# Patient Record
Sex: Female | Born: 1990 | Race: Black or African American | Hispanic: No | Marital: Single | Smoking: Current every day smoker
Health system: Southern US, Community
[De-identification: ages and names within clinical notes are randomized; demographics above are authoritative.]

## PROBLEM LIST (undated history)

## (undated) ENCOUNTER — Inpatient Hospital Stay (HOSPITAL_COMMUNITY): Payer: Self-pay

## (undated) DIAGNOSIS — B009 Herpesviral infection, unspecified: Secondary | ICD-10-CM

## (undated) DIAGNOSIS — A749 Chlamydial infection, unspecified: Secondary | ICD-10-CM

## (undated) DIAGNOSIS — O24419 Gestational diabetes mellitus in pregnancy, unspecified control: Secondary | ICD-10-CM

## (undated) DIAGNOSIS — A599 Trichomoniasis, unspecified: Secondary | ICD-10-CM

## (undated) HISTORY — PX: NO PAST SURGERIES: SHX2092

---

## 2012-12-28 ENCOUNTER — Inpatient Hospital Stay (HOSPITAL_COMMUNITY)
Admission: AD | Admit: 2012-12-28 | Discharge: 2012-12-28 | Disposition: A | Discharge status: Home / Self Care | Payer: Self-pay | Source: Ambulatory Visit | Attending: Obstetrics & Gynecology | Admitting: Obstetrics & Gynecology

## 2012-12-28 DIAGNOSIS — R300 Dysuria: Secondary | ICD-10-CM | POA: Insufficient documentation

## 2012-12-28 DIAGNOSIS — A499 Bacterial infection, unspecified: Secondary | ICD-10-CM | POA: Insufficient documentation

## 2012-12-28 DIAGNOSIS — N760 Acute vaginitis: Secondary | ICD-10-CM | POA: Insufficient documentation

## 2012-12-28 DIAGNOSIS — Z3202 Encounter for pregnancy test, result negative: Secondary | ICD-10-CM | POA: Insufficient documentation

## 2012-12-28 DIAGNOSIS — B9689 Other specified bacterial agents as the cause of diseases classified elsewhere: Secondary | ICD-10-CM | POA: Insufficient documentation

## 2012-12-28 LAB — URINALYSIS, ROUTINE W REFLEX MICROSCOPIC
Leukocytes, UA: NEGATIVE
Nitrite: NEGATIVE
Specific Gravity, Urine: >1.03 — ABNORMAL HIGH (ref 1.005–1.030)
Urobilinogen, UA: 0.2 mg/dL (ref 0.0–1.0)

## 2012-12-28 LAB — POCT PREGNANCY, URINE: Preg Test, Ur: NEGATIVE

## 2012-12-28 LAB — URINE MICROSCOPIC-ADD ON

## 2012-12-28 MED ORDER — METRONIDAZOLE 500 MG PO TABS: 500.0000 mg | ORAL_TABLET | Freq: Two times a day (BID) | ORAL | Status: DC

## 2012-12-28 NOTE — MAU Note (Signed)
Patient states she had a positive home pregnancy test yesterday and has been having pain with urination since then. Denies any bleeding or discharge.

## 2012-12-28 NOTE — MAU Note (Signed)
Patient presents to MAU with c/o burning with urination since earlier today. Reports she had a positive home pregnancy test a couple weeks ago.  Reports new sexual partner; denies vaginal bleeding or cramping.

## 2012-12-28 NOTE — MAU Provider Note (Signed)
History     CSN: [id number]  Arrival date and time: 12/28/12 1743   None     Chief Complaint  Patient presents with  . Possible Pregnancy  . Dysuria   HPI 22 y.o. with c/o burning with urination since earlier today. Reports she had a positive home pregnancy test a couple weeks ago.    History  Substance Use Topics  . Smoking status: Not on file  . Smokeless tobacco: Not on file  . Alcohol Use: Not on file    Allergies: No Known Allergies  No prescriptions prior to admission    Review of Systems  Constitutional: Negative.   Respiratory: Negative.   Cardiovascular: Negative.   Gastrointestinal: Negative for nausea, vomiting, abdominal pain, diarrhea and constipation.  Genitourinary: Positive for dysuria. Negative for urgency, frequency, hematuria and flank pain.       Negative for vaginal bleeding, vaginal discharge, dyspareunia  Musculoskeletal: Negative.   Neurological: Negative.   Psychiatric/Behavioral: Negative.    Physical Exam   Blood pressure 140/80, pulse 82, temperature 98.2 F (36.8 C), temperature source Oral, resp. rate 16, height 5\' 4"  (1.626 m), weight 124 lb 12.8 oz (56.609 kg), last menstrual period 11/12/2012, SpO2 100.00%.  Physical Exam  Nursing note and vitals reviewed. Constitutional: She is oriented to person, place, and time. She appears well-developed and well-nourished. No distress.  Cardiovascular: Normal rate.   Respiratory: Effort normal.  GI: Soft. There is no tenderness.  Genitourinary: There is no tenderness or lesion on the right labia. There is no tenderness or lesion on the left labia. Uterus is not enlarged and not tender. Cervix exhibits no motion tenderness, no discharge and no friability. Right adnexum displays no mass, no tenderness and no fullness. Left adnexum displays no mass, no tenderness and no fullness. No bleeding around the vagina. Vaginal discharge found.  Musculoskeletal: Normal range of motion.  Neurological: She  is alert and oriented to person, place, and time.  Skin: Skin is warm and dry.  Psychiatric: She has a normal mood and affect.    MAU Course  Procedures Results for orders placed during the hospital encounter of 12/28/12 (from the past 24 hour(s))  URINALYSIS, ROUTINE W REFLEX MICROSCOPIC     Status: Abnormal   Collection Time    12/28/12  6:10 PM      Result Value Range   Color, Urine YELLOW  YELLOW   APPearance CLEAR  CLEAR   Specific Gravity, Urine >1.030 (*) 1.005 - 1.030   pH 6.0  5.0 - 8.0   Glucose, UA NEGATIVE  NEGATIVE mg/dL   Hgb urine dipstick NEGATIVE  NEGATIVE   Bilirubin Urine NEGATIVE  NEGATIVE   Ketones, ur NEGATIVE  NEGATIVE mg/dL   Protein, ur 30 (*) NEGATIVE mg/dL   Urobilinogen, UA 0.2  0.0 - 1.0 mg/dL   Nitrite NEGATIVE  NEGATIVE   Leukocytes, UA NEGATIVE  NEGATIVE  URINE MICROSCOPIC-ADD ON     Status: Abnormal   Collection Time    12/28/12  6:10 PM      Result Value Range   Squamous Epithelial / LPF MANY (*) RARE   WBC, UA 0-2  <3 WBC/hpf   Urine-Other MUCOUS PRESENT    POCT PREGNANCY, URINE     Status: None   Collection Time    12/28/12  6:18 PM      Result Value Range   Preg Test, Ur NEGATIVE  NEGATIVE  WET PREP, GENITAL     Status: Abnormal   Collection  Time    12/28/12  9:00 PM      Result Value Range   Yeast Wet Prep HPF POC NONE SEEN  NONE SEEN   Trich, Wet Prep NONE SEEN  NONE SEEN   Clue Cells Wet Prep HPF POC FEW (*) NONE SEEN   WBC, Wet Prep HPF POC NONE SEEN  NONE SEEN  HCG, SERUM, QUALITATIVE     Status: None   Collection Time    12/28/12  9:02 PM      Result Value Range   Preg, Serum NEGATIVE  NEGATIVE     Assessment and Plan   1. Bacterial vaginosis   2. Negative pregnancy test       Medication List    TAKE these medications       metroNIDAZOLE 500 MG tablet  Commonly known as:  FLAGYL  Take 1 tablet (500 mg total) by mouth 2 (two) times daily.        Follow-up Information   Follow up with [facility] [place]. (As needed)    Contact information:   [address] [place] [address] [phone]        Jabri Blancett 12/29/2012, 4:28 AM

## 2012-12-28 NOTE — MAU Note (Signed)
Pt presents to desk , states she left to get something to eat.

## 2012-12-28 NOTE — MAU Note (Signed)
Not in lobby

## 2012-12-29 LAB — GC/CHLAMYDIA PROBE AMP
CT Probe RNA: NEGATIVE
GC Probe RNA: NEGATIVE

## 2013-01-10 ENCOUNTER — Inpatient Hospital Stay (HOSPITAL_COMMUNITY)
Admission: AD | Admit: 2013-01-10 | Discharge: 2013-01-10 | Disposition: A | Discharge status: Home / Self Care | Payer: No Typology Code available for payment source | Source: Ambulatory Visit | Attending: Obstetrics & Gynecology | Admitting: Obstetrics & Gynecology

## 2013-01-10 ENCOUNTER — Encounter (HOSPITAL_COMMUNITY): Payer: Self-pay | Admitting: *Deleted

## 2013-01-10 DIAGNOSIS — Y92009 Unspecified place in unspecified non-institutional (private) residence as the place of occurrence of the external cause: Secondary | ICD-10-CM | POA: Insufficient documentation

## 2013-01-10 DIAGNOSIS — Z3202 Encounter for pregnancy test, result negative: Secondary | ICD-10-CM | POA: Insufficient documentation

## 2013-01-10 DIAGNOSIS — N912 Amenorrhea, unspecified: Secondary | ICD-10-CM | POA: Insufficient documentation

## 2013-01-10 LAB — POCT PREGNANCY, URINE: Preg Test, Ur: NEGATIVE

## 2013-01-10 LAB — HCG, SERUM, QUALITATIVE: Preg, Serum: NEGATIVE

## 2013-01-10 NOTE — Progress Notes (Signed)
Written and verbal d/c instructions given and understanding voiced. SANE RN will return to make pics and then pt ready for d/c and pt aware
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
## 2013-01-10 NOTE — MAU Note (Signed)
Call left for Social Worker Tedra to see pt per request of Raynelle Fanning PA. SANE RN Beecher Mcardle in to see pt

## 2013-01-13 NOTE — SANE Note (Signed)
Attempted phone call for f/u photos, wrong number.

## 2013-03-07 ENCOUNTER — Inpatient Hospital Stay (HOSPITAL_COMMUNITY): Payer: Self-pay

## 2013-03-07 ENCOUNTER — Encounter (HOSPITAL_COMMUNITY): Payer: Self-pay | Admitting: *Deleted

## 2013-03-07 ENCOUNTER — Inpatient Hospital Stay (HOSPITAL_COMMUNITY)
Admission: AD | Admit: 2013-03-07 | Discharge: 2013-03-07 | Disposition: A | Discharge status: Home / Self Care | Payer: Self-pay | Source: Ambulatory Visit | Attending: Obstetrics & Gynecology | Admitting: Obstetrics & Gynecology

## 2013-03-07 DIAGNOSIS — R109 Unspecified abdominal pain: Secondary | ICD-10-CM | POA: Insufficient documentation

## 2013-03-07 DIAGNOSIS — O210 Mild hyperemesis gravidarum: Secondary | ICD-10-CM | POA: Insufficient documentation

## 2013-03-07 DIAGNOSIS — O99891 Other specified diseases and conditions complicating pregnancy: Secondary | ICD-10-CM | POA: Insufficient documentation

## 2013-03-07 DIAGNOSIS — Z3490 Encounter for supervision of normal pregnancy, unspecified, unspecified trimester: Secondary | ICD-10-CM

## 2013-03-07 LAB — URINALYSIS, ROUTINE W REFLEX MICROSCOPIC
Bilirubin Urine: NEGATIVE
Ketones, ur: NEGATIVE mg/dL
Nitrite: NEGATIVE
Urobilinogen, UA: 0.2 mg/dL (ref 0.0–1.0)

## 2013-03-07 LAB — WET PREP, GENITAL
Clue Cells Wet Prep HPF POC: NONE SEEN
Trich, Wet Prep: NONE SEEN

## 2013-03-07 LAB — HCG, QUANTITATIVE, PREGNANCY: hCG, Beta Chain, Quant, S: 47365 m[IU]/mL — ABNORMAL HIGH (ref <5)

## 2013-03-07 LAB — URINE MICROSCOPIC-ADD ON

## 2013-03-07 NOTE — MAU Note (Signed)
C/o intermittent N&V for past 2 weeks but is not nauseated today; had cramping on the R lower abdominal area earlier today but no cramping now; +UPT;

## 2013-03-07 NOTE — MAU Provider Note (Signed)
Chief Complaint: Abdominal Pain and Morning Sickness   First Provider Initiated Contact with Patient 03/07/13 1303     SUBJECTIVE HPI: Shelly Tate is a 22 y.o. G1P0 at unsure gestational age (negative UPT 01/10/2013 and states LMP was January)  who presents with 2 day hx right and left lower quadrant crampy abdominal pain which was so severe this am she could not walk and had to crawl.  Not havingl now but no cramps. No bleeding.  Past Medical History  Diagnosis Date  . Medical history non-contributory    OB History   Grav Para Term Preterm Abortions TAB SAB Ect Mult Living   1              # Outc Date GA Lbr Len/2nd Wgt Sex Del Anes PTL Lv   1 CUR              Past Surgical History  Procedure Laterality Date  . No past surgeries     History   Social History  . Marital Status: Single    Spouse Name: N/A    Number of Children: N/A  . Years of Education: N/A   Occupational History  . Not on file.   Social History Main Topics  . Smoking status: Former Games developer  . Smokeless tobacco: Not on file  . Alcohol Use: No  . Drug Use: No  . Sexually Active: Not Currently    Birth Control/ Protection: None     Comment: was on pill but stopped cause made pt feel sick   Other Topics Concern  . Not on file   Social History Narrative  . No narrative on file   No current facility-administered medications on file prior to encounter.   No current outpatient prescriptions on file prior to encounter.   No Known Allergies  ROS: Pertinent items in HPI  OBJECTIVE Blood pressure 134/73, pulse 99, temperature 97.1 F (36.2 C), temperature source Oral, resp. rate 16, height 5\' 3"  (1.6 m), weight 56.246 kg (124 lb), last menstrual period 11/07/2012. GENERAL: Well-developed, well-nourished female in no acute distress.  HEENT: Normocephalic HEART: normal rate RESP: normal effort ABDOMEN: Soft, non-tender EXTREMITIES: Nontender, no edema NEURO: Alert and oriented SPECULUM EXAM:  NEFG, physiologic discharge, no blood noted, cervix clean BIMANUAL: cervix L/C; uterus normal size, no adnexal tenderness or masses  LAB RESULTS Results for orders placed during the hospital encounter of 03/07/13 (from the past 24 hour(s))  URINALYSIS, ROUTINE W REFLEX MICROSCOPIC     Status: Abnormal   Collection Time    03/07/13 12:15 PM      Result Value Range   Color, Urine YELLOW  YELLOW   APPearance CLEAR  CLEAR   Specific Gravity, Urine 1.015  1.005 - 1.030   pH 7.0  5.0 - 8.0   Glucose, UA NEGATIVE  NEGATIVE mg/dL   Hgb urine dipstick NEGATIVE  NEGATIVE   Bilirubin Urine NEGATIVE  NEGATIVE   Ketones, ur NEGATIVE  NEGATIVE mg/dL   Protein, ur NEGATIVE  NEGATIVE mg/dL   Urobilinogen, UA 0.2  0.0 - 1.0 mg/dL   Nitrite NEGATIVE  NEGATIVE   Leukocytes, UA TRACE (*) NEGATIVE  URINE MICROSCOPIC-ADD ON     Status: Abnormal   Collection Time    03/07/13 12:15 PM      Result Value Range   Squamous Epithelial / LPF FEW (*) RARE   WBC, UA 7-10  <3 WBC/hpf   Bacteria, UA RARE  RARE  POCT PREGNANCY, URINE  Status: Abnormal   Collection Time    03/07/13 12:28 PM      Result Value Range   Preg Test, Ur POSITIVE (*) NEGATIVE  HCG, QUANTITATIVE, PREGNANCY     Status: Abnormal   Collection Time    03/07/13  1:19 PM      Result Value Range   hCG, Beta Chain, Quant, [place] [address] (*) <5 mIU/mL  GC/CHLAMYDIA PROBE AMP     Status: None   Collection Time    03/07/13  1:30 PM      Result Value Range   CT Probe RNA NEGATIVE  NEGATIVE   GC Probe RNA NEGATIVE  NEGATIVE  WET PREP, GENITAL     Status: Abnormal   Collection Time    03/07/13  1:30 PM      Result Value Range   Yeast Wet Prep HPF POC NONE SEEN  NONE SEEN   Trich, Wet Prep NONE SEEN  NONE SEEN   Clue Cells Wet Prep HPF POC NONE SEEN  NONE SEEN   WBC, Wet Prep HPF POC FEW (*) NONE SEEN    IMAGING No results found.  MAU COURSE Care assumed by Renee Rival, CNM   Shareka Casale Colin Mulders, CNM 03/07/2013  1:34 PM

## 2013-03-07 NOTE — MAU Note (Signed)
Started about last wk, feels sick when eats, threw up last wk when had red juice and hotdogs..  Hasn't had a period in 4 months, did a home test last wk - was positive.  No bleeding or discharge.  Has been having cramping at times, none now.

## 2013-03-08 LAB — GC/CHLAMYDIA PROBE AMP
CT Probe RNA: NEGATIVE
GC Probe RNA: NEGATIVE

## 2013-04-03 ENCOUNTER — Encounter (HOSPITAL_COMMUNITY): Payer: Self-pay

## 2013-04-03 ENCOUNTER — Inpatient Hospital Stay (HOSPITAL_COMMUNITY)
Admission: AD | Admit: 2013-04-03 | Discharge: 2013-04-03 | Disposition: A | Discharge status: Home / Self Care | Payer: Medicaid Other | Source: Ambulatory Visit | Attending: Family Medicine | Admitting: Family Medicine

## 2013-04-03 DIAGNOSIS — O219 Vomiting of pregnancy, unspecified: Secondary | ICD-10-CM

## 2013-04-03 DIAGNOSIS — O210 Mild hyperemesis gravidarum: Secondary | ICD-10-CM

## 2013-04-03 LAB — URINALYSIS, ROUTINE W REFLEX MICROSCOPIC
Bilirubin Urine: NEGATIVE
Glucose, UA: NEGATIVE mg/dL
Specific Gravity, Urine: 1.025 (ref 1.005–1.030)
Urobilinogen, UA: 1 mg/dL (ref 0.0–1.0)

## 2013-04-03 LAB — URINE MICROSCOPIC-ADD ON

## 2013-04-03 MED ORDER — ONDANSETRON 8 MG PO TBDP
8.0000 mg | ORAL_TABLET | Freq: Once | ORAL | Status: AC
  Administered 2013-04-03: 8 mg via ORAL
  Filled 2013-04-03: qty 1

## 2013-04-03 MED ORDER — PROMETHAZINE HCL 25 MG PO TABS: 25.0000 mg | ORAL_TABLET | Freq: Four times a day (QID) | ORAL | Status: DC | PRN

## 2013-04-03 NOTE — MAU Provider Note (Signed)
History     CSN: [id number]  Arrival date and time: 04/03/13 1424   None     Chief Complaint  Patient presents with  . Emesis During Pregnancy   HPI 22 y.o. G1P0 at [redacted]w[redacted]d with n/v x 1 month, "can't keep anything down" x 2 weeks. Asking for ice upon arrival. States she has not been able to keep down hot wings, spaghetti with meat sauce, beef cup o'noodles, fruit punch. She does say that she can tolerate fruit, pickles, crackers, pork rinds and water. Plans on care with Dr. Gaynell Face.    Past Medical History  Diagnosis Date  . Medical history non-contributory     Past Surgical History  Procedure Laterality Date  . No past surgeries      Family History  Problem Relation Age of Onset  . Alcohol abuse Neg Hx     History  Substance Use Topics  . Smoking status: Former Games developer  . Smokeless tobacco: Not on file  . Alcohol Use: No    Allergies: No Known Allergies  No prescriptions prior to admission    Review of Systems  Constitutional: Negative.   Respiratory: Negative.   Cardiovascular: Negative.   Gastrointestinal: Positive for nausea and vomiting. Negative for abdominal pain, diarrhea and constipation.  Genitourinary: Negative for dysuria, urgency, frequency, hematuria and flank pain.       Negative for vaginal bleeding, vaginal discharge, dyspareunia  Musculoskeletal: Negative.   Neurological: Negative.   Psychiatric/Behavioral: Negative.    Physical Exam   Blood pressure 121/63, pulse 76, temperature 98.6 F (37 C), temperature source Oral, resp. rate 16, height 5\' 3"  (1.6 m), weight 125 lb 9.6 oz (56.972 kg), last menstrual period 11/07/2012, SpO2 100.00%.  Physical Exam  Nursing note and vitals reviewed. Constitutional: She is oriented to person, place, and time. She appears well-developed and well-nourished. No distress.  Cardiovascular: Normal rate.   Respiratory: Effort normal.  GI: Soft. There is no tenderness.  Musculoskeletal: Normal range of  motion.  Neurological: She is alert and oriented to person, place, and time.  Skin: Skin is warm and dry.  Psychiatric: She has a normal mood and affect.    MAU Course  Procedures Results for orders placed during the hospital encounter of 04/03/13 (from the past 24 hour(s))  URINALYSIS, ROUTINE W REFLEX MICROSCOPIC     Status: Abnormal   Collection Time    04/03/13  2:53 PM      Result Value Range   Color, Urine YELLOW  YELLOW   APPearance CLEAR  CLEAR   Specific Gravity, Urine 1.025  1.005 - 1.030   pH 6.0  5.0 - 8.0   Glucose, UA NEGATIVE  NEGATIVE mg/dL   Hgb urine dipstick TRACE (*) NEGATIVE   Bilirubin Urine NEGATIVE  NEGATIVE   Ketones, ur 15 (*) NEGATIVE mg/dL   Protein, ur NEGATIVE  NEGATIVE mg/dL   Urobilinogen, UA 1.0  0.0 - 1.0 mg/dL   Nitrite NEGATIVE  NEGATIVE   Leukocytes, UA SMALL (*) NEGATIVE  URINE MICROSCOPIC-ADD ON     Status: Abnormal   Collection Time    04/03/13  2:53 PM      Result Value Range   Squamous Epithelial / LPF FEW (*) RARE   WBC, UA 3-6  <3 WBC/hpf   RBC / HPF 0-2  <3 RBC/hpf   Zofran ODT 8 mg PO - reports no nausea  Assessment and Plan   1. Nausea and vomiting in pregnancy prior to [redacted] weeks gestation  Rev'd diet - no spicy, greasy, fried, heavy foods - light/bland diet, eat the foods she tolerates already, rx phenergan F/U for prenatal care    Medication List    TAKE these medications       promethazine 25 MG tablet  Commonly known as:  PHENERGAN  Take 1 tablet (25 mg total) by mouth every 6 (six) hours as needed for nausea.            Follow-up Information   Schedule an appointment as soon as possible for a visit with Kathreen Cosier, MD.   Contact information:   [address] [place] [address] [phone]         [facility] 04/03/2013, 3:52 PM

## 2013-04-03 NOTE — MAU Provider Note (Signed)
Chart reviewed and agree with management and plan.  

## 2013-04-03 NOTE — MAU Note (Signed)
Patient is in with c/o n/v. She denies vaginal bleeding or abdominal cramping.

## 2013-04-03 NOTE — MAU Note (Signed)
Patient states she has been having nausea and vomiting for about 2 weeks, not able to keep anything down. Denies pain or bleeding.

## 2013-04-18 LAB — OB RESULTS CONSOLE HIV ANTIBODY (ROUTINE TESTING): HIV: NONREACTIVE

## 2013-04-18 LAB — OB RESULTS CONSOLE GC/CHLAMYDIA
Chlamydia: NEGATIVE
Gonorrhea: NEGATIVE

## 2013-04-18 LAB — OB RESULTS CONSOLE ABO/RH: RH TYPE: POSITIVE

## 2013-04-18 LAB — OB RESULTS CONSOLE RPR: RPR: NONREACTIVE

## 2013-04-18 LAB — OB RESULTS CONSOLE RUBELLA ANTIBODY, IGM: RUBELLA: IMMUNE

## 2013-04-18 LAB — OB RESULTS CONSOLE ANTIBODY SCREEN: ANTIBODY SCREEN: NEGATIVE

## 2013-04-18 LAB — OB RESULTS CONSOLE HEPATITIS B SURFACE ANTIGEN: HEP B S AG: NEGATIVE

## 2013-05-03 ENCOUNTER — Encounter: Payer: Medicaid Other | Admitting: Family

## 2013-05-20 ENCOUNTER — Inpatient Hospital Stay (HOSPITAL_COMMUNITY)
Admission: AD | Admit: 2013-05-20 | Discharge: 2013-05-20 | Disposition: A | Discharge status: Home / Self Care | Payer: Medicaid Other | Source: Ambulatory Visit | Attending: Obstetrics | Admitting: Obstetrics

## 2013-05-20 ENCOUNTER — Encounter (HOSPITAL_COMMUNITY): Payer: Self-pay | Admitting: *Deleted

## 2013-05-20 DIAGNOSIS — O99891 Other specified diseases and conditions complicating pregnancy: Secondary | ICD-10-CM | POA: Insufficient documentation

## 2013-05-20 DIAGNOSIS — N390 Urinary tract infection, site not specified: Secondary | ICD-10-CM

## 2013-05-20 DIAGNOSIS — R109 Unspecified abdominal pain: Secondary | ICD-10-CM | POA: Insufficient documentation

## 2013-05-20 LAB — URINE MICROSCOPIC-ADD ON

## 2013-05-20 LAB — URINALYSIS, ROUTINE W REFLEX MICROSCOPIC
Bilirubin Urine: NEGATIVE
Glucose, UA: NEGATIVE mg/dL
Ketones, ur: NEGATIVE mg/dL
Protein, ur: NEGATIVE mg/dL
pH: 6 (ref 5.0–8.0)

## 2013-05-20 MED ORDER — NITROFURANTOIN MONOHYD MACRO 100 MG PO CAPS: 100.0000 mg | ORAL_CAPSULE | Freq: Two times a day (BID) | ORAL | Status: DC

## 2013-05-20 NOTE — Progress Notes (Signed)
Written and verbal d/c instructions given and understanding voiced. 

## 2013-05-20 NOTE — MAU Note (Signed)
Pt. States cramping started at 18 week mark. Denies any bleeding. Denies discharge. Next OB Sept 2. Has not had ultrasound yet and wants to know gender. Does feel baby move and pt. States this has decreased.

## 2013-05-20 NOTE — MAU Note (Signed)
Pt presents with complaints of abdominal cramping that she states has been going on for awhile. Also states she is still vomiting and is taking phenergan and hasn't felt her baby move in a while.

## 2013-05-20 NOTE — MAU Provider Note (Signed)
History     CSN: [id number]  Arrival date and time: 05/20/13 1707   First Provider Initiated Contact with Patient 05/20/13 2009      Chief Complaint  Patient presents with  . Abdominal Cramping   HPI  Pt is a G1P0 at 18.2 wks IUP with report of lower pelvic cramping for past two days.  No bleeding or leaking of fluid.  Denies abnormal vaginal discharge or UTI symptoms.  No prior occurrence of this pain.   Concerned because they have not ordered her ultrasound in Dr. Elsie Stain office.  Next appointment is on September 2nd.    Past Medical History  Diagnosis Date  . Medical history non-contributory     Past Surgical History  Procedure Laterality Date  . No past surgeries      Family History  Problem Relation Age of Onset  . Alcohol abuse Neg Hx     History  Substance Use Topics  . Smoking status: Former Games developer  . Smokeless tobacco: Not on file  . Alcohol Use: No    Allergies: No Known Allergies  Prescriptions prior to admission  Medication Sig Dispense Refill  . promethazine (PHENERGAN) 25 MG tablet Take 1 tablet (25 mg total) by mouth every 6 (six) hours as needed for nausea.  60 tablet  0    Review of Systems  Gastrointestinal: Positive for abdominal pain.  Neurological: Positive for tremors (cramping).  All other systems reviewed and are negative.   Physical Exam   Blood pressure 111/62, pulse 66, temperature 97.3 F (36.3 C), temperature source Oral, resp. rate 16, height 5\' 4"  (1.626 m), weight 58.968 kg (130 lb), last menstrual period 11/07/2012.  Physical Exam  Constitutional: She is oriented to person, place, and time. She appears well-developed and well-nourished. No distress.  HENT:  Head: Normocephalic.  Neck: Normal range of motion. Neck supple.  Cardiovascular: Normal rate, regular rhythm and normal heart sounds.   Respiratory: Effort normal and breath sounds normal.  GI: Soft. There is no tenderness.  Genitourinary: No bleeding around the  vagina. No vaginal discharge found.  Musculoskeletal: Normal range of motion. She exhibits no edema.  Neurological: She is alert and oriented to person, place, and time.  Skin: Skin is warm and dry.  Cervix - closed/long  MAU Course  Procedures Results for orders placed during the hospital encounter of 05/20/13 (from the past 24 hour(s))  URINALYSIS, ROUTINE W REFLEX MICROSCOPIC     Status: Abnormal   Collection Time    05/20/13  5:30 PM      Result Value Range   Color, Urine YELLOW  YELLOW   APPearance CLEAR  CLEAR   Specific Gravity, Urine 1.010  1.005 - 1.030   pH 6.0  5.0 - 8.0   Glucose, UA NEGATIVE  NEGATIVE mg/dL   Hgb urine dipstick NEGATIVE  NEGATIVE   Bilirubin Urine NEGATIVE  NEGATIVE   Ketones, ur NEGATIVE  NEGATIVE mg/dL   Protein, ur NEGATIVE  NEGATIVE mg/dL   Urobilinogen, UA 0.2  0.0 - 1.0 mg/dL   Nitrite NEGATIVE  NEGATIVE   Leukocytes, UA TRACE (*) NEGATIVE  URINE MICROSCOPIC-ADD ON     Status: Abnormal   Collection Time    05/20/13  5:30 PM      Result Value Range   Squamous Epithelial / LPF MANY (*) RARE   WBC, UA 0-2  <3 WBC/hpf   Bacteria, UA FEW (*) RARE   Urine-Other MUCOUS PRESENT       Assessment  and Plan  Bacturia in Pregnancy  Plan: DC to home RX Macrobid Urine Culture Outpatient Ultrasound follow-up by provider  [facility] 05/20/2013, 8:10 PM

## 2013-05-22 LAB — URINE CULTURE: Colony Count: 3000

## 2013-05-26 ENCOUNTER — Ambulatory Visit (HOSPITAL_COMMUNITY)
Admission: RE | Admit: 2013-05-26 | Discharge: 2013-05-26 | Disposition: A | Discharge status: Home / Self Care | Payer: Medicaid Other | Source: Ambulatory Visit | Attending: Family | Admitting: Family

## 2013-05-26 DIAGNOSIS — Z363 Encounter for antenatal screening for malformations: Secondary | ICD-10-CM | POA: Insufficient documentation

## 2013-05-26 DIAGNOSIS — O2342 Unspecified infection of urinary tract in pregnancy, second trimester: Secondary | ICD-10-CM

## 2013-05-26 DIAGNOSIS — Z1389 Encounter for screening for other disorder: Secondary | ICD-10-CM | POA: Insufficient documentation

## 2013-05-26 DIAGNOSIS — O358XX0 Maternal care for other (suspected) fetal abnormality and damage, not applicable or unspecified: Secondary | ICD-10-CM | POA: Insufficient documentation

## 2013-06-01 ENCOUNTER — Other Ambulatory Visit (HOSPITAL_COMMUNITY): Payer: Self-pay | Admitting: Family

## 2013-07-05 ENCOUNTER — Encounter (HOSPITAL_COMMUNITY): Payer: Self-pay | Admitting: *Deleted

## 2013-07-05 ENCOUNTER — Inpatient Hospital Stay (HOSPITAL_COMMUNITY)
Admission: AD | Admit: 2013-07-05 | Discharge: 2013-07-05 | Disposition: A | Discharge status: Home / Self Care | Payer: Medicaid Other | Source: Ambulatory Visit | Attending: Obstetrics | Admitting: Obstetrics

## 2013-07-05 DIAGNOSIS — O212 Late vomiting of pregnancy: Secondary | ICD-10-CM | POA: Insufficient documentation

## 2013-07-05 DIAGNOSIS — O99891 Other specified diseases and conditions complicating pregnancy: Secondary | ICD-10-CM | POA: Insufficient documentation

## 2013-07-05 DIAGNOSIS — R51 Headache: Secondary | ICD-10-CM | POA: Insufficient documentation

## 2013-07-05 DIAGNOSIS — K529 Noninfective gastroenteritis and colitis, unspecified: Secondary | ICD-10-CM

## 2013-07-05 DIAGNOSIS — A088 Other specified intestinal infections: Secondary | ICD-10-CM | POA: Insufficient documentation

## 2013-07-05 DIAGNOSIS — K5289 Other specified noninfective gastroenteritis and colitis: Secondary | ICD-10-CM

## 2013-07-05 LAB — URINE MICROSCOPIC-ADD ON

## 2013-07-05 LAB — URINALYSIS, ROUTINE W REFLEX MICROSCOPIC
Bilirubin Urine: NEGATIVE
Glucose, UA: NEGATIVE mg/dL
Hgb urine dipstick: NEGATIVE
Ketones, ur: NEGATIVE mg/dL
pH: 6 (ref 5.0–8.0)

## 2013-07-05 MED ORDER — PROMETHAZINE HCL 25 MG PO TABS: 25.0000 mg | ORAL_TABLET | Freq: Four times a day (QID) | ORAL | Status: DC | PRN

## 2013-07-05 NOTE — MAU Note (Addendum)
States she ate neck bones in broth with cabbage which had been on stove x 4 days, began to feel nausea and HA within 30 minutes, here today due to N/V/HA and not feeling fetal movement as well as previous days.  Emesis x 2 today, yellow, stomach hurting, not like contractions

## 2013-07-05 NOTE — MAU Note (Signed)
Pt states "I had ate some neck bones, and it was the 4th day I ate it and I have been throwing up this yellow stuff"

## 2013-07-05 NOTE — MAU Provider Note (Signed)
History     CSN: [id number]  Arrival date and time: 07/05/13 1426   First Provider Initiated Contact with Patient 07/05/13 1632      Chief Complaint  Patient presents with  . Nausea  . Emesis   HPI [name] is a 22 y.o. G1P0 at [redacted]w[redacted]d who presents to MAU today with complaint of N/V and HA x 2 days. The patient states that she ate some "neck bones in broth" that had been left out on the stove x 4 days. The patient then had N/V, upper abdominal pain and HA that has since resolved. The patient states very mild HA now, no N/V or abdominal pain today. She denies contractions, vaginal bleeding, discharge, LOF, fever or UTI symptoms. She reports good fetal movement. The patient states that she was able to eat today without emesis and is hungry now.   OB History   Grav Para Term Preterm Abortions TAB SAB Ect Mult Living   1               Past Medical History  Diagnosis Date  . Medical history non-contributory     Past Surgical History  Procedure Laterality Date  . No past surgeries      Family History  Problem Relation Age of Onset  . Alcohol abuse Neg Hx   . Arthritis Neg Hx   . Asthma Neg Hx   . Birth defects Neg Hx   . Cancer Neg Hx   . COPD Neg Hx   . Depression Neg Hx   . Diabetes Neg Hx   . Drug abuse Neg Hx   . Early death Neg Hx   . Hearing loss Neg Hx   . Heart disease Neg Hx   . Hyperlipidemia Neg Hx   . Hypertension Neg Hx   . Kidney disease Neg Hx   . Learning disabilities Neg Hx   . Mental illness Neg Hx   . Mental retardation Neg Hx   . Miscarriages / Stillbirths Neg Hx   . Vision loss Neg Hx     History  Substance Use Topics  . Smoking status: Former Games developer  . Smokeless tobacco: Not on file  . Alcohol Use: No    Allergies: No Known Allergies  No prescriptions prior to admission    Review of Systems  Constitutional: Negative for fever and malaise/fatigue.  Gastrointestinal: Negative for nausea, vomiting, abdominal pain,  diarrhea and constipation.  Genitourinary: Negative for dysuria, urgency and frequency.       Neg - vaginal bleeding, discharge, LOF  Neurological: Negative for dizziness, loss of consciousness and weakness.   Physical Exam   Blood pressure 110/60, pulse 91, temperature 98.2 F (36.8 C), resp. rate 18, height 5\' 3"  (1.6 m), weight 134 lb (60.782 kg), last menstrual period 11/07/2012, SpO2 100.00%.  Physical Exam  Constitutional: She is oriented to person, place, and time. She appears well-developed and well-nourished. No distress.  HENT:  Head: Normocephalic and atraumatic.  Cardiovascular: Normal rate.   Respiratory: Effort normal.  GI: Soft. She exhibits no distension and no mass. There is no tenderness. There is no rebound and no guarding.  Neurological: She is alert and oriented to person, place, and time.  Skin: Skin is warm and dry. No erythema.  Psychiatric: She has a normal mood and affect.   Results for orders placed during the hospital encounter of 07/05/13 (from the past 24 hour(s))  URINALYSIS, ROUTINE W REFLEX MICROSCOPIC     Status: Abnormal  Collection Time    07/05/13  2:55 PM      Result Value Range   Color, Urine YELLOW  YELLOW   APPearance HAZY (*) CLEAR   Specific Gravity, Urine 1.025  1.005 - 1.030   pH 6.0  5.0 - 8.0   Glucose, UA NEGATIVE  NEGATIVE mg/dL   Hgb urine dipstick NEGATIVE  NEGATIVE   Bilirubin Urine NEGATIVE  NEGATIVE   Ketones, ur NEGATIVE  NEGATIVE mg/dL   Protein, ur NEGATIVE  NEGATIVE mg/dL   Urobilinogen, UA 0.2  0.0 - 1.0 mg/dL   Nitrite NEGATIVE  NEGATIVE   Leukocytes, UA TRACE (*) NEGATIVE  URINE MICROSCOPIC-ADD ON     Status: Abnormal   Collection Time    07/05/13  2:55 PM      Result Value Range   Squamous Epithelial / LPF MANY (*) RARE   WBC, UA 0-2  <3 WBC/hpf   Bacteria, UA FEW (*) RARE    Fetal Monitoring: Baseline: 135 bpm, moderate variability, + accelerations, 1 variable deceleration Contractions: None MAU Course   Procedures None  MDM UA shows no signs of dehydration Symptoms have resolved Patient able to take in PO today without emesis and asking for food in MAU  Assessment and Plan  A: Acute viral gastroenteritis  P: Discharge home Px for Phenergan sent to patient's pharmacy BRAT diet information given on AVS Patient advised to gradually return to normal diet Patient encouraged to keep appointment for routine prenatal care with Dr. Gaynell Face next week or call for sooner appointment if symptoms persist or worsen Patient may return to MAU as needed or if her condition were to change or worsen  Freddi Starr, PA-C  07/05/2013, 4:33 PM

## 2013-07-05 NOTE — Progress Notes (Signed)
Pt states she started vomitng yesterday vomiting. Pt states last time she vomited was yesterday

## 2013-08-01 ENCOUNTER — Other Ambulatory Visit (HOSPITAL_COMMUNITY): Payer: Self-pay | Admitting: Obstetrics

## 2013-08-01 DIAGNOSIS — Z3689 Encounter for other specified antenatal screening: Secondary | ICD-10-CM

## 2013-08-08 ENCOUNTER — Other Ambulatory Visit (HOSPITAL_COMMUNITY): Payer: Self-pay | Admitting: Obstetrics

## 2013-08-08 ENCOUNTER — Ambulatory Visit (HOSPITAL_COMMUNITY): Admission: RE | Admit: 2013-08-08 | Payer: Medicaid Other | Source: Ambulatory Visit

## 2013-08-08 ENCOUNTER — Ambulatory Visit (HOSPITAL_COMMUNITY)
Admission: RE | Admit: 2013-08-08 | Discharge: 2013-08-08 | Disposition: A | Discharge status: Home / Self Care | Payer: Medicaid Other | Source: Ambulatory Visit | Attending: Obstetrics | Admitting: Obstetrics

## 2013-08-08 DIAGNOSIS — Z3689 Encounter for other specified antenatal screening: Secondary | ICD-10-CM

## 2013-10-09 ENCOUNTER — Encounter (HOSPITAL_COMMUNITY): Payer: Self-pay

## 2013-10-09 ENCOUNTER — Inpatient Hospital Stay (HOSPITAL_COMMUNITY)
Admission: AD | Admit: 2013-10-09 | Discharge: 2013-10-09 | Disposition: A | Discharge status: Home / Self Care | Payer: Medicaid Other | Source: Ambulatory Visit | Attending: Obstetrics | Admitting: Obstetrics

## 2013-10-09 DIAGNOSIS — R109 Unspecified abdominal pain: Secondary | ICD-10-CM | POA: Insufficient documentation

## 2013-10-09 DIAGNOSIS — O99891 Other specified diseases and conditions complicating pregnancy: Secondary | ICD-10-CM | POA: Insufficient documentation

## 2013-10-09 NOTE — MAU Note (Signed)
Pt presents complaining of "stomach pains" every few minutes. Denies vaginal bleeding or loss of fluid.

## 2013-10-12 NOTE — L&D Delivery Note (Signed)
Delivery Note At 11:36 AM a viable female was delivered via Vaginal, Spontaneous Delivery (Presentation: Left Occiput Anterior).  APGAR: 4,.  Neonatology called.   Placenta status: delivered intact with cord traction .  Cord: 3 vessels with the following complications: None.   Tight nuchal cord x 1 clamped/cut on the perineum prior to delivery of the anterior shoulder. Anesthesia: Epidural, local Episiotomy: None Lacerations: Labial Suture Repair: 3.0 vicryl rapide Est. Blood Loss (mL): 200 ml  Mom to postpartum.  Baby to Couplet care / Skin to Skin.  Neeb-MOORE,Marylan Glore A 10/21/2013, 11:54 AM

## 2013-10-19 ENCOUNTER — Telehealth (HOSPITAL_COMMUNITY): Payer: Self-pay | Admitting: *Deleted

## 2013-10-19 ENCOUNTER — Inpatient Hospital Stay (HOSPITAL_COMMUNITY)
Admission: AD | Admit: 2013-10-19 | Discharge: 2013-10-19 | Disposition: A | Discharge status: Home / Self Care | Payer: Medicaid Other | Source: Ambulatory Visit | Attending: Obstetrics | Admitting: Obstetrics

## 2013-10-19 ENCOUNTER — Encounter (HOSPITAL_COMMUNITY): Payer: Self-pay | Admitting: General Practice

## 2013-10-19 DIAGNOSIS — O479 False labor, unspecified: Secondary | ICD-10-CM | POA: Insufficient documentation

## 2013-10-19 NOTE — Telephone Encounter (Signed)
Preadmission screen  

## 2013-10-19 NOTE — MAU Note (Signed)
Pt presents to MAU for a routine labor check

## 2013-10-19 NOTE — MAU Note (Signed)
Pt reports contractions q 2 minutes.

## 2013-10-19 NOTE — Discharge Instructions (Signed)

## 2013-10-20 ENCOUNTER — Encounter (HOSPITAL_COMMUNITY): Payer: Self-pay

## 2013-10-20 ENCOUNTER — Inpatient Hospital Stay (HOSPITAL_COMMUNITY)
Admission: AD | Admit: 2013-10-20 | Discharge: 2013-10-23 | DRG: 775 | Disposition: A | Discharge status: Home / Self Care | Payer: Medicaid Other | Source: Ambulatory Visit | Attending: Obstetrics | Admitting: Obstetrics

## 2013-10-20 LAB — GROUP B STREP BY PCR: GROUP B STREP BY PCR: NEGATIVE

## 2013-10-20 LAB — OB RESULTS CONSOLE GBS: GBS: NEGATIVE

## 2013-10-20 MED ORDER — LACTATED RINGERS IV SOLN
500.0000 mL | INTRAVENOUS | Status: DC | PRN
Start: 2013-10-20 — End: 2013-10-21

## 2013-10-20 NOTE — MAU Note (Addendum)
I've been contracting since yesterday. Lost mucous plug. Leaking watery d/c since mid morning

## 2013-10-20 NOTE — MAU Note (Signed)
States was 1 cm yesterday.  Leaking fluid at 1 pm, used 2 pads and changed her clothing.  Watery and mucus mixed together pt states.  Contractions worse since arriving here.

## 2013-10-21 ENCOUNTER — Encounter (HOSPITAL_COMMUNITY): Payer: Self-pay | Admitting: *Deleted

## 2013-10-21 ENCOUNTER — Inpatient Hospital Stay (HOSPITAL_COMMUNITY): Payer: Medicaid Other | Admitting: Anesthesiology

## 2013-10-21 ENCOUNTER — Encounter (HOSPITAL_COMMUNITY): Payer: Medicaid Other | Admitting: Anesthesiology

## 2013-10-21 LAB — COMPREHENSIVE METABOLIC PANEL
ALBUMIN: 3.2 g/dL — AB (ref 3.5–5.2)
ALT: 22 U/L (ref 0–35)
AST: 25 U/L (ref 0–37)
Alkaline Phosphatase: 354 U/L — ABNORMAL HIGH (ref 39–117)
BUN: 10 mg/dL (ref 6–23)
CHLORIDE: 102 meq/L (ref 96–112)
CO2: 24 mEq/L (ref 19–32)
Calcium: 9.8 mg/dL (ref 8.4–10.5)
Creatinine, Ser: 0.8 mg/dL (ref 0.50–1.10)
GFR calc Af Amer: >90 mL/min (ref >90)
GFR calc non Af Amer: >90 mL/min (ref >90)
Glucose, Bld: 92 mg/dL (ref 70–99)
POTASSIUM: 4.3 meq/L (ref 3.7–5.3)
Sodium: 141 mEq/L (ref 137–147)
TOTAL PROTEIN: 7.2 g/dL (ref 6.0–8.3)

## 2013-10-21 LAB — CBC
HCT: 35 % — ABNORMAL LOW (ref 36.0–46.0)
HCT: 39.1 % (ref 36.0–46.0)
HEMATOCRIT: 35.9 % — AB (ref 36.0–46.0)
HEMOGLOBIN: 12.1 g/dL (ref 12.0–15.0)
HEMOGLOBIN: 13.4 g/dL (ref 12.0–15.0)
Hemoglobin: 11.8 g/dL — ABNORMAL LOW (ref 12.0–15.0)
MCH: 26.9 pg (ref 26.0–34.0)
MCH: 27.1 pg (ref 26.0–34.0)
MCH: 27.4 pg (ref 26.0–34.0)
MCHC: 33.7 g/dL (ref 30.0–36.0)
MCHC: 33.7 g/dL (ref 30.0–36.0)
MCHC: 34.3 g/dL (ref 30.0–36.0)
MCV: 80 fL (ref 78.0–100.0)
MCV: 80 fL (ref 78.0–100.0)
MCV: 80.5 fL (ref 78.0–100.0)
PLATELETS: 149 10*3/uL — AB (ref 150–400)
PLATELETS: 191 10*3/uL (ref 150–400)
Platelets: 161 10*3/uL (ref 150–400)
RBC: 4.35 MIL/uL (ref 3.87–5.11)
RBC: 4.49 MIL/uL (ref 3.87–5.11)
RBC: 4.89 MIL/uL (ref 3.87–5.11)
RDW: 14.1 % (ref 11.5–15.5)
RDW: 14.2 % (ref 11.5–15.5)
RDW: 14.2 % (ref 11.5–15.5)
WBC: 11.8 10*3/uL — ABNORMAL HIGH (ref 4.0–10.5)
WBC: 13.1 10*3/uL — ABNORMAL HIGH (ref 4.0–10.5)
WBC: 9.1 10*3/uL (ref 4.0–10.5)

## 2013-10-21 LAB — RPR: RPR Ser Ql: NONREACTIVE

## 2013-10-21 LAB — LACTATE DEHYDROGENASE: LDH: 273 U/L — AB (ref 94–250)

## 2013-10-21 MED ORDER — WITCH HAZEL-GLYCERIN EX PADS: 1.0000 "application " | MEDICATED_PAD | CUTANEOUS | Status: DC | PRN

## 2013-10-21 MED ORDER — MEASLES, MUMPS & RUBELLA VAC ~~LOC~~ INJ: 0.5000 mL | INJECTION | Freq: Once | SUBCUTANEOUS | Status: DC

## 2013-10-21 MED ORDER — BENZOCAINE-MENTHOL 20-0.5 % EX AERO: 1.0000 "application " | INHALATION_SPRAY | CUTANEOUS | Status: DC | PRN

## 2013-10-21 MED ORDER — ZOLPIDEM TARTRATE 5 MG PO TABS: 5.0000 mg | ORAL_TABLET | Freq: Every evening | ORAL | Status: DC | PRN

## 2013-10-21 MED ORDER — ONDANSETRON HCL 4 MG/2ML IJ SOLN: 4.0000 mg | Freq: Four times a day (QID) | INTRAMUSCULAR | Status: DC | PRN

## 2013-10-21 MED ORDER — OXYTOCIN 40 UNITS IN LACTATED RINGERS INFUSION - SIMPLE MED
62.5000 mL/h | INTRAVENOUS | Status: DC
  Administered 2013-10-21: 62.5 mL/h via INTRAVENOUS
  Filled 2013-10-21: qty 1000

## 2013-10-21 MED ORDER — PHENYLEPHRINE 40 MCG/ML (10ML) SYRINGE FOR IV PUSH (FOR BLOOD PRESSURE SUPPORT)
80.0000 ug | PREFILLED_SYRINGE | INTRAVENOUS | Status: DC | PRN
  Filled 2013-10-21: qty 10
  Filled 2013-10-21: qty 2

## 2013-10-21 MED ORDER — EPHEDRINE 5 MG/ML INJ
10.0000 mg | INTRAVENOUS | Status: DC | PRN
  Filled 2013-10-21: qty 2
  Filled 2013-10-21: qty 4

## 2013-10-21 MED ORDER — INFLUENZA VAC SPLIT QUAD 0.5 ML IM SUSP
0.5000 mL | INTRAMUSCULAR | Status: AC
  Administered 2013-10-22: 0.5 mL via INTRAMUSCULAR
  Filled 2013-10-21: qty 0.5

## 2013-10-21 MED ORDER — PHENYLEPHRINE 40 MCG/ML (10ML) SYRINGE FOR IV PUSH (FOR BLOOD PRESSURE SUPPORT)
80.0000 ug | PREFILLED_SYRINGE | INTRAVENOUS | Status: DC | PRN
  Filled 2013-10-21: qty 2

## 2013-10-21 MED ORDER — DIPHENHYDRAMINE HCL 50 MG/ML IJ SOLN: 12.5000 mg | INTRAMUSCULAR | Status: DC | PRN

## 2013-10-21 MED ORDER — SENNOSIDES-DOCUSATE SODIUM 8.6-50 MG PO TABS
2.0000 | ORAL_TABLET | ORAL | Status: DC
  Administered 2013-10-21 – 2013-10-22 (×2): 2 via ORAL
  Filled 2013-10-21 (×2): qty 2

## 2013-10-21 MED ORDER — LANOLIN HYDROUS EX OINT: TOPICAL_OINTMENT | CUTANEOUS | Status: DC | PRN

## 2013-10-21 MED ORDER — BUTORPHANOL TARTRATE 1 MG/ML IJ SOLN
2.0000 mg | INTRAMUSCULAR | Status: DC
  Administered 2013-10-21 (×2): 2 mg via INTRAVENOUS
  Filled 2013-10-21 (×2): qty 2

## 2013-10-21 MED ORDER — LIDOCAINE HCL (PF) 1 % IJ SOLN
INTRAMUSCULAR | Status: DC | PRN
  Administered 2013-10-21 (×2): 5 mL

## 2013-10-21 MED ORDER — OXYTOCIN BOLUS FROM INFUSION: 500.0000 mL | INTRAVENOUS | Status: DC

## 2013-10-21 MED ORDER — LIDOCAINE HCL (PF) 1 % IJ SOLN
30.0000 mL | INTRAMUSCULAR | Status: DC | PRN
  Filled 2013-10-21 (×2): qty 30

## 2013-10-21 MED ORDER — DIPHENHYDRAMINE HCL 25 MG PO CAPS: 25.0000 mg | ORAL_CAPSULE | Freq: Four times a day (QID) | ORAL | Status: DC | PRN

## 2013-10-21 MED ORDER — LACTATED RINGERS IV SOLN: 500.0000 mL | Freq: Once | INTRAVENOUS | Status: DC

## 2013-10-21 MED ORDER — EPHEDRINE 5 MG/ML INJ
10.0000 mg | INTRAVENOUS | Status: DC | PRN
  Filled 2013-10-21: qty 2

## 2013-10-21 MED ORDER — FENTANYL 2.5 MCG/ML BUPIVACAINE 1/10 % EPIDURAL INFUSION (WH - ANES)
14.0000 mL/h | INTRAMUSCULAR | Status: DC | PRN
  Administered 2013-10-21: 14 mL/h via EPIDURAL
  Filled 2013-10-21: qty 125

## 2013-10-21 MED ORDER — OXYCODONE-ACETAMINOPHEN 5-325 MG PO TABS: 1.0000 | ORAL_TABLET | ORAL | Status: DC | PRN

## 2013-10-21 MED ORDER — DIBUCAINE 1 % RE OINT: 1.0000 "application " | TOPICAL_OINTMENT | RECTAL | Status: DC | PRN

## 2013-10-21 MED ORDER — FERROUS SULFATE 325 (65 FE) MG PO TABS
325.0000 mg | ORAL_TABLET | Freq: Two times a day (BID) | ORAL | Status: DC
  Administered 2013-10-21 – 2013-10-22 (×3): 325 mg via ORAL
  Filled 2013-10-21 (×2): qty 1

## 2013-10-21 MED ORDER — ONDANSETRON HCL 4 MG/2ML IJ SOLN: 4.0000 mg | INTRAMUSCULAR | Status: DC | PRN

## 2013-10-21 MED ORDER — LACTATED RINGERS IV SOLN
INTRAVENOUS | Status: DC
  Administered 2013-10-21: via INTRAUTERINE

## 2013-10-21 MED ORDER — TERBUTALINE SULFATE 1 MG/ML IJ SOLN: 0.2500 mg | Freq: Once | INTRAMUSCULAR | Status: DC | PRN

## 2013-10-21 MED ORDER — FLEET ENEMA 7-19 GM/118ML RE ENEM: 1.0000 | ENEMA | RECTAL | Status: DC | PRN

## 2013-10-21 MED ORDER — ACETAMINOPHEN 325 MG PO TABS: 650.0000 mg | ORAL_TABLET | ORAL | Status: DC | PRN

## 2013-10-21 MED ORDER — OXYTOCIN 40 UNITS IN LACTATED RINGERS INFUSION - SIMPLE MED
1.0000 m[IU]/min | INTRAVENOUS | Status: DC
  Administered 2013-10-21: 1 m[IU]/min via INTRAVENOUS

## 2013-10-21 MED ORDER — IBUPROFEN 600 MG PO TABS
600.0000 mg | ORAL_TABLET | Freq: Four times a day (QID) | ORAL | Status: DC
  Administered 2013-10-21 – 2013-10-23 (×7): 600 mg via ORAL
  Filled 2013-10-21 (×7): qty 1

## 2013-10-21 MED ORDER — CITRIC ACID-SODIUM CITRATE 334-500 MG/5ML PO SOLN: 30.0000 mL | ORAL | Status: DC | PRN

## 2013-10-21 MED ORDER — ONDANSETRON HCL 4 MG PO TABS: 4.0000 mg | ORAL_TABLET | ORAL | Status: DC | PRN

## 2013-10-21 MED ORDER — PRENATAL MULTIVITAMIN CH: 1.0000 | ORAL_TABLET | Freq: Every day | ORAL | Status: DC

## 2013-10-21 MED ORDER — TETANUS-DIPHTH-ACELL PERTUSSIS 5-2.5-18.5 LF-MCG/0.5 IM SUSP
0.5000 mL | Freq: Once | INTRAMUSCULAR | Status: AC
  Administered 2013-10-22: 0.5 mL via INTRAMUSCULAR
  Filled 2013-10-21: qty 0.5

## 2013-10-21 MED ORDER — LACTATED RINGERS IV SOLN
INTRAVENOUS | Status: DC
  Administered 2013-10-21: 09:00:00 via INTRAVENOUS

## 2013-10-21 MED ORDER — IBUPROFEN 600 MG PO TABS: 600.0000 mg | ORAL_TABLET | Freq: Four times a day (QID) | ORAL | Status: DC | PRN

## 2013-10-21 MED ORDER — MAGNESIUM HYDROXIDE 400 MG/5ML PO SUSP: 30.0000 mL | ORAL | Status: DC | PRN

## 2013-10-21 NOTE — Anesthesia Preprocedure Evaluation (Signed)
Anesthesia Evaluation  Patient identified by MRN, date of birth, ID band Patient awake    Reviewed: Allergy & Precautions, H&P , Patient's Chart, lab work & pertinent test results  Airway Mallampati: II  TM Distance: >3 FB Neck ROM: full    Dental   Pulmonary  breath sounds clear to auscultation        Cardiovascular hypertension, Rhythm:regular Rate:Normal     Neuro/Psych    GI/Hepatic   Endo/Other    Renal/GU      Musculoskeletal   Abdominal   Peds  Hematology   Anesthesia Other Findings   Reproductive/Obstetrics (+) Pregnancy                           Anesthesia Physical Anesthesia Plan  ASA: III  Anesthesia Plan: Epidural   Post-op Pain Management:    Induction:   Airway Management Planned:   Additional Equipment:   Intra-op Plan:   Post-operative Plan:   Informed Consent: I have reviewed the patients History and Physical, chart, labs and discussed the procedure including the risks, benefits and alternatives for the proposed anesthesia with the patient or authorized representative who has indicated his/her understanding and acceptance.     Plan Discussed with:   Anesthesia Plan Comments:        Anesthesia Quick Evaluation  

## 2013-10-21 NOTE — Anesthesia Procedure Notes (Signed)
Epidural Patient location during procedure: OB Start time: 10/21/2013 8:48 AM  Staffing Anesthesiologist: Brayton CavesJACKSON, Artesha Wemhoff Performed by: anesthesiologist   Preanesthetic Checklist Completed: patient identified, site marked, surgical consent, pre-op evaluation, timeout performed, IV checked, risks and benefits discussed and monitors and equipment checked  Epidural Patient position: sitting Prep: site prepped and draped and DuraPrep Patient monitoring: continuous pulse ox and blood pressure Approach: midline Injection technique: LOR air  Needle:  Needle type: Tuohy  Needle gauge: 17 G Needle length: 9 cm and 9 Needle insertion depth: 5 cm cm Catheter type: closed end flexible Catheter size: 19 Gauge Catheter at skin depth: 10 cm Test dose: negative  Assessment Events: blood not aspirated, injection not painful, no injection resistance, negative IV test and no paresthesia  Additional Notes Patient identified.  Risk benefits discussed including failed block, incomplete pain control, headache, nerve damage, paralysis, blood pressure changes, nausea, vomiting, reactions to medication both toxic or allergic, and postpartum back pain.  Patient expressed understanding and wished to proceed.  All questions were answered.  Sterile technique used throughout procedure and epidural site dressed with sterile barrier dressing. No paresthesia or other complications noted.The patient did not experience any signs of intravascular injection such as tinnitus or metallic taste in mouth nor signs of intrathecal spread such as rapid motor block. Please see nursing notes for vital signs.

## 2013-10-21 NOTE — H&P (Signed)
Shelly Tate is Tate 23 y.o. female presenting with  SROM. Maternal Medical History:  Reason for admission: Rupture of membranes.   Contractions: Frequency: regular.   Perceived severity is mild.    Fetal activity: Perceived fetal activity is normal.    Prenatal complications: no prenatal complications Prenatal Complications - Diabetes: none.    OB History   Grav Para Term Preterm Abortions TAB SAB Ect Mult Living   1              Past Medical History  Diagnosis Date  . Medical history non-contributory    Past Surgical History  Procedure Laterality Date  . No past surgeries     Family History: family history is negative for Alcohol abuse, Arthritis, Asthma, Birth defects, Cancer, COPD, Depression, Diabetes, Drug abuse, Early death, Hearing loss, Heart disease, Hyperlipidemia, Hypertension, Kidney disease, Learning disabilities, Mental illness, Mental retardation, Miscarriages / Stillbirths, and Vision loss. Social History:  reports that she has never smoked. She has never used smokeless tobacco. She reports that she does not drink alcohol or use illicit drugs.     Review of Systems  Constitutional: Negative for fever.  Eyes: Negative for blurred vision.  Respiratory: Negative for shortness of breath.   Gastrointestinal: Negative for vomiting.  Skin: Negative for rash.  Neurological: Negative for headaches.    Dilation: 3 Effacement (%): 100 Station: -1 Exam by:: Dr.L.Saravia-Moore Blood pressure 142/74, pulse 85, temperature 97.9 F (36.6 C), temperature source Oral, resp. rate 18, height 5\' 3"  (1.6 m), weight 74.027 kg (163 lb 3.2 oz), last menstrual period 11/07/2012, SpO2 100.00%. Maternal Exam:  Uterine Assessment: Contraction strength is mild.  Contraction frequency is irregular.   Abdomen: Patient reports no abdominal tenderness. Fetal presentation: vertex  Introitus: Normal vulva. Ferning test: positive.  Amniotic fluid character: clear.     Fetal  Exam Fetal Monitor Review: Variability: moderate (6-25 bpm).   Pattern: accelerations present and variable decelerations.    Fetal State Assessment: Category I - tracings are normal.     Physical Exam  Constitutional: She appears well-developed.  HENT:  Head: Normocephalic.  Neck: Neck supple. No thyromegaly present.  Cardiovascular: Normal rate and regular rhythm.   Respiratory: Breath sounds normal.  GI: Soft. Bowel sounds are normal.  Skin: No rash noted.    Prenatal labs: ABO, Rh: B/Positive/-- (07/08 0000) Antibody: Negative (07/08 0000) Rubella: Immune (07/08 0000) RPR: Nonreactive (07/08 0000)  HBsAg: Negative (07/08 0000)  HIV: Non-reactive (07/08 0000)  GBS: Negative (01/09 0000)   Assessment/Plan: [place]ara @ 1632w2d.  Early labor.  FHT c/w fetal well-being; occasional moderate variable decelerations in the setting of SROM.  Borderline B/P elevations--no neurologic symptoms  Admit Amnioinfusion Possible augmentation of labor with low dose Pitocin per protocol PIH labs   Shelly Tate,Shelly Tate 10/21/2013, 12:46 AM

## 2013-10-21 NOTE — Consult Note (Signed)
Neonatology Note:  Attendance at Code Apgar:  Our team responded to a Code Apgar call to room # 162 following NSVD, due to infant with tight CAN requiring ligation to reduce, and slow to breathe after delivery. The requesting physician was Dr. Berka-Moore. The mother is a G1P0 B pos, GBS neg with occasional variable FHR decelerations and amnioinfusion. ROM occurred 22 hours PTD and the fluid was clear. At delivery, the baby was stunned and slow to breathe. The OB nursing staff in attendance gave vigorous stimulation and a Code Apgar was called. Our team arrived just after 1 minute of life, at which time the baby was starting to cry and had some muscle tone. We bulb suctioned and got only scant clear secretions. We placed a pulse oximeter which showed the O2 saturations were normal at 5 minutes. Ap 4/9. The baby's lungs were clear to ausc and he had no distress. I spoke with the mother in the DR, then transferred the baby to the Pediatrician's care.  Shelly Tate C. Paulette Rockford, MD  

## 2013-10-21 NOTE — Lactation Note (Signed)
This note was copied from the chart of Shelly Janeece Fittingerriquara Doan. Lactation Consultation Note  Patient Name: Shelly Tate ZOXWR'UToday's Date: 10/21/2013 Reason for consult: Initial assessment Per RN Mom has decided to bottle feeding with formula.  Maternal Data Formula Feeding for Exclusion: Yes Reason for exclusion: Mother's choice to formula and breast feed on admission  Feeding Feeding Type: Formula Nipple Type: Fast - flow  LATCH Score/Interventions                      Lactation Tools Discussed/Used     Consult Status Consult Status: Complete    Alfred LevinsGranger, Bren Borys Ann 10/21/2013, 6:11 PM

## 2013-10-22 LAB — CBC
HCT: 35.5 % — ABNORMAL LOW (ref 36.0–46.0)
Hemoglobin: 11.9 g/dL — ABNORMAL LOW (ref 12.0–15.0)
MCH: 27.1 pg (ref 26.0–34.0)
MCHC: 33.5 g/dL (ref 30.0–36.0)
MCV: 80.9 fL (ref 78.0–100.0)
Platelets: 141 10*3/uL — ABNORMAL LOW (ref 150–400)
RBC: 4.39 MIL/uL (ref 3.87–5.11)
RDW: 14.5 % (ref 11.5–15.5)
WBC: 16.9 10*3/uL — ABNORMAL HIGH (ref 4.0–10.5)

## 2013-10-22 NOTE — Anesthesia Postprocedure Evaluation (Signed)
Anesthesia Post Note  Patient: Shelly Tate  Procedure(s) Performed: * No procedures listed *  Anesthesia type: Epidural  Patient location: Mother/Baby  Post pain: Pain level controlled  Post assessment: Post-op Vital signs reviewed  Last Vitals:  Filed Vitals:   10/22/13 0650  BP: 118/70  Pulse: 67  Temp: 36.5 C  Resp: 18    Post vital signs: Reviewed  Level of consciousness:alert  Complications: No apparent anesthesia complications

## 2013-10-22 NOTE — Progress Notes (Signed)
Patient ID: Shelly Tate, female   DOB: [birth year], [age] y.o.   MRN: [id number] Post Partum Day 1 S/P spontaneous vaginal RH status/Rubella reviewed.  Feeding: unknown Subjective: No HA, SOB, CP, F/C, breast symptoms. Normal vaginal bleeding, no clots.     Objective: BP 118/70  Pulse 67  Temp(Src) 97.7 F (36.5 C) (Oral)  Resp 18  Ht 5\' 3"  (1.6 m)  Wt 74.027 kg (163 lb 3.2 oz)  BMI 28.92 kg/m2  SpO2 99%  LMP 11/07/2012   Physical Exam:  General: alert Lochia: appropriate Uterine Fundus: firm DVT Evaluation: No evidence of DVT seen on physical exam. Ext: No c/c/e  Recent Labs  10/21/13 1202 10/22/13 0600  HGB 11.8* 11.9*  HCT 35.0* 35.5*      Assessment/Plan: 22 y.o.  PPD #1 .  normal postpartum exam Continue current postpartum care Ambulate   LOS: 2 days   Bondarenko-MOORE,Mirayah Wren A 10/22/2013, 11:08 AM

## 2013-10-23 ENCOUNTER — Inpatient Hospital Stay (HOSPITAL_COMMUNITY): Admission: RE | Admit: 2013-10-23 | Payer: Medicaid Other | Source: Ambulatory Visit

## 2013-10-23 NOTE — Discharge Summary (Signed)
Obstetric Discharge Summary Reason for Admission: onset of labor Prenatal Procedures: none Intrapartum Procedures: spontaneous vaginal delivery Postpartum Procedures: none Complications-Operative and Postpartum: none Hemoglobin  Date Value Range Status  10/22/2013 11.9* 12.0 - 15.0 g/dL Final     HCT  Date Value Range Status  10/22/2013 35.5* 36.0 - 46.0 % Final    Physical Exam:  General: alert Lochia: appropriate Uterine Fundus: firm Incision: healing well DVT Evaluation: No evidence of DVT seen on physical exam.  Discharge Diagnoses: Term Pregnancy-delivered  Discharge Information: Date: 10/23/2013 Activity: pelvic rest Diet: routine Medications: Percocet Condition: stable Instructions: refer to practice specific booklet Discharge to: home Follow-up Information   Follow up with Shelly CosierMARSHALL,Shelly Tate A, MD.   Specialty:  Obstetrics and Gynecology   Contact information:   [address] [place] [address] [phone]       Newborn Data: Live born female  Birth Weight: 7 lb 3 oz (3260 g) APGAR: 4, 9  Home with mother.  Shelly Tate A 10/23/2013, 7:15 AM

## 2013-10-23 NOTE — Progress Notes (Signed)
UR chart review completed.  

## 2013-10-23 NOTE — Discharge Instructions (Signed)
Discharge instructions   You can wash your hair  Shower  Eat what you want  Drink what you want  See me in 6 weeks  Your ankles are going to swell more in the next 2 weeks than when pregnant  No sex for 6 weeks   Deavon Podgorski A, MD 10/23/2013

## 2013-10-23 NOTE — Clinical Social Work Maternal (Signed)
Clinical Social Work Department  PSYCHOSOCIAL ASSESSMENT - MATERNAL/CHILD  10/23/2013  Patient: Shelly Tate,Shelly Tate Account Number: [id number] Admit Date: 10/20/2013  Childs Name:  Shelly Tate, Jr.   Clinical Social Worker: Brode Sculley, LCSW Date/Time: 10/23/2013 12:55 PM  Date Referred: 10/23/2013  Referral source   CN    Referred reason   Other - See comment   Other referral source:  I: FAMILY / HOME ENVIRONMENT  Child's legal guardian: PARENT  Guardian - Name  Guardian - Age  Guardian - Address   Shelly Tate  22  [address]; [place], [place] [address]   Shelly Tate  22  (incarcerated)   Other household support members/support persons  Other support:  FOB's mother   II PSYCHOSOCIAL DATA  Information Source: Patient Interview  Financial and Community Resources  Employment:  Financial resources: Medicaid  If Medicaid - County: [place]  Other   Food Stamps   School / Grade:  Maternity Care Coordinator / Child Services Coordination / Early Interventions: Cultural issues impacting care:  III STRENGTHS  Strengths   Adequate Resources   Home prepared for Child (including basic supplies)   Supportive family/friends   Strength comment:  IV RISK FACTORS AND CURRENT PROBLEMS  Current Problem: YES  Risk Factor & Current Problem  Patient Issue  Family Issue  Risk Factor / Current Problem Comment   Other - See comment  Y  N  Limited PNC   V SOCIAL WORK ASSESSMENT  CSW referral received to assess reason for limited PNC. Pt explained that she was not able start PNC due to lack of PNC of Medicaid. She denies any illegal substance use & verbalized understanding of hospital drug testing policy. UDS was not sent, meconium results are pending. Pt was accompanied by FOB's parents, who appear supportive. FOB is incarcerated. Pt has all the necessary supplies for the infant. She appears to be bonding well with the infant & appropriate at this time. CSW will continue to monitor  drug screen results & make a referral if needed.   VI SOCIAL WORK PLAN  Social Work Plan   No Further Intervention Required / No Barriers to Discharge   Type of pt/family education:  If child protective services report - county:  If child protective services report - date:  Information/referral to community resources comment:  Other social work plan:       Clinical Social Work Department PSYCHOSOCIAL ASSESSMENT - MATERNAL/CHILD 10/23/2013  Patient:  Shelly Tate, Shelly Tate  Account Number:  [id number]  Admit Date:  10/20/2013  Marjo Bicker Name:   Shelly Tate, Shelly Tate    Clinical Social Worker:  Shelly Putnam, LCSW   Date/Time:  10/23/2013 12:55 PM  Date Referred:  10/23/2013   Referral source  CN     Referred reason  Other - See comment   Other referral source:    I:  FAMILY / HOME ENVIRONMENT Child's legal guardian:  PARENT  Guardian - Name Guardian - Age Guardian - Address  Shelly Tate 22 [address] [place]; [place], [place] [address]  Shelly Tate 22 (incarcerated)   Other household support members/support persons Other support:   FOB's mother    II  PSYCHOSOCIAL DATA Information Source:  Patient Interview  Event organiser Employment:   Surveyor, quantity resources:  [employer] If Medicaid - County:  [employer] Other  Chemical engineer / Grade:   Maternity Care Coordinator / Child Services Coordination / Early Interventions:  Cultural issues impacting care:    III  STRENGTHS Strengths  Adequate Resources  Home prepared for Child (including basic supplies)  Supportive family/friends   Strength comment:    IV  RISK FACTORS AND CURRENT PROBLEMS Current Problem:  YES   Risk Factor & Current Problem Patient Issue Family Issue Risk Factor / Current Problem Comment  Other - See comment Y N Limited PNC    V  SOCIAL WORK ASSESSMENT CSW referral received to assess reason for limited PNC.  Pt explained that she was not able start [facility] due to lack of [facility] of Medicaid.  She denies any illegal substance use & verbalized understanding of hospital drug testing policy. UDS was not sent, meconium results are pending.  Pt was accompanied by FOB's parents, who appear supportive.  FOB is incarcerated.  Pt has all the necessary supplies for the infant.  She appears to be bonding well with the infant & appropriate at this  time.  CSW will continue to monitor drug screen results & make a referral if needed.      VI SOCIAL WORK PLAN Social Work Plan  No Further Intervention Required / No Barriers to Discharge   Type of pt/family education:   If child protective services report - county:   If child protective services report - date:   Information/referral to community resources comment:   Other social work plan:

## 2014-05-29 ENCOUNTER — Telehealth: Payer: Self-pay | Admitting: Pediatrics

## 2014-05-29 MED ORDER — PERMETHRIN 5 % EX CREA: 1.0000 "application " | TOPICAL_CREAM | Freq: Once | CUTANEOUS | Status: DC

## 2014-05-29 NOTE — Telephone Encounter (Signed)
Shakirra's son (Tevin Hamlor Montez HagemanJr) [facility] and diagnosed with scabies.  All household contacts are being treated at the same time.

## 2014-06-30 IMAGING — US US OB DETAIL+14 WK
1 of 2 series · 12 of 28 positions shown · non-contrast
Comparison: none

[Series 1: us ob detail +14 wk · 12 of 84 slices shown]
[im 1/84]
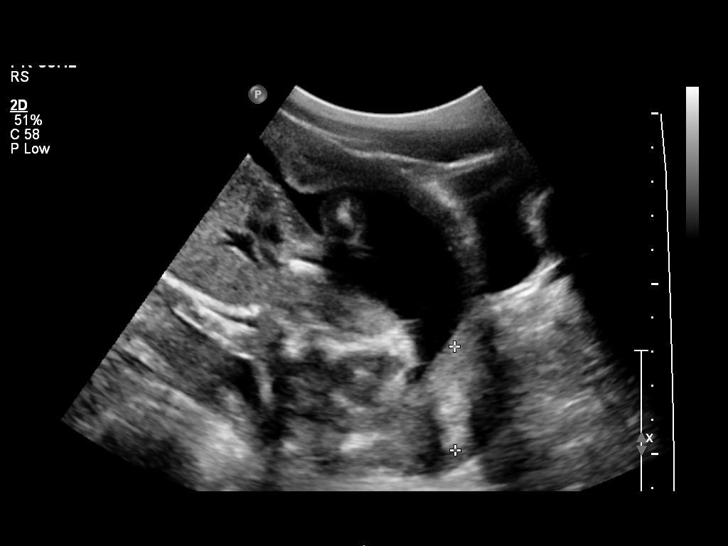
[im 7/84]
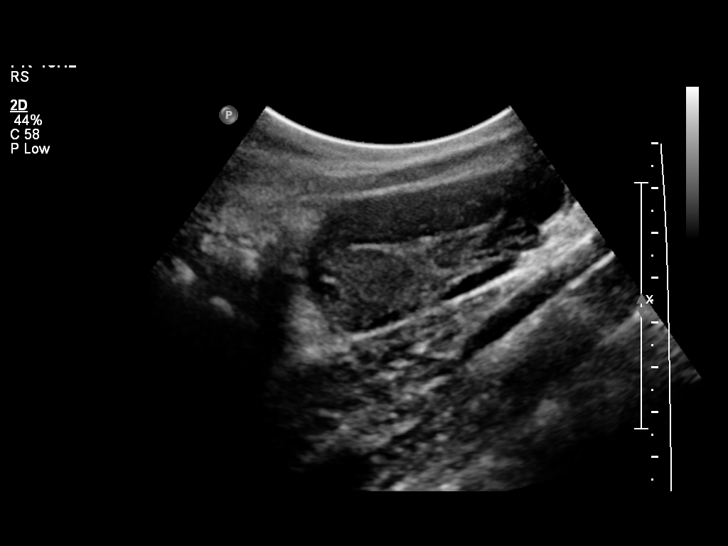
[im 13/84]
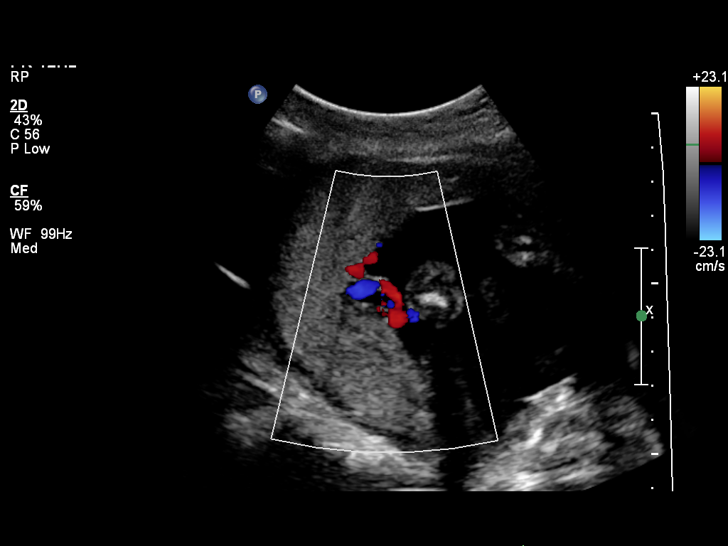
[im 23/84]
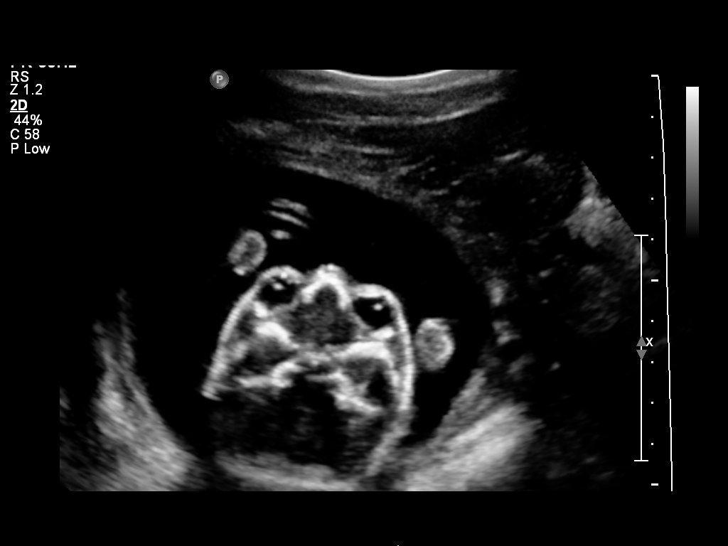
[im 29/84]
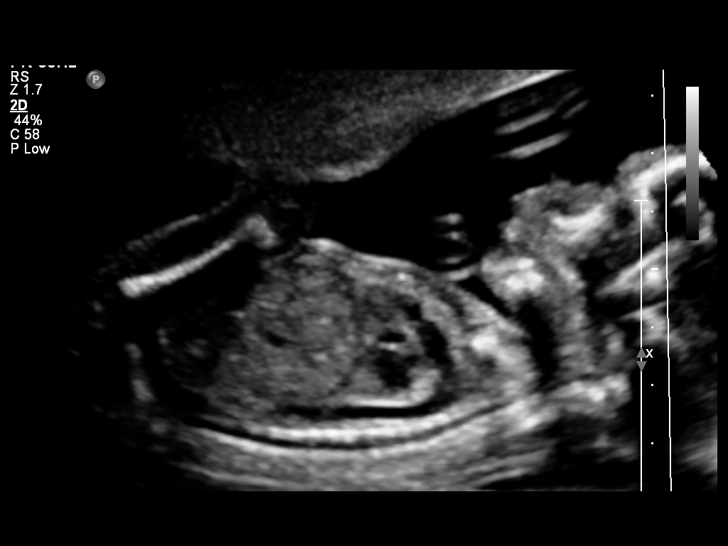
[im 36/84]
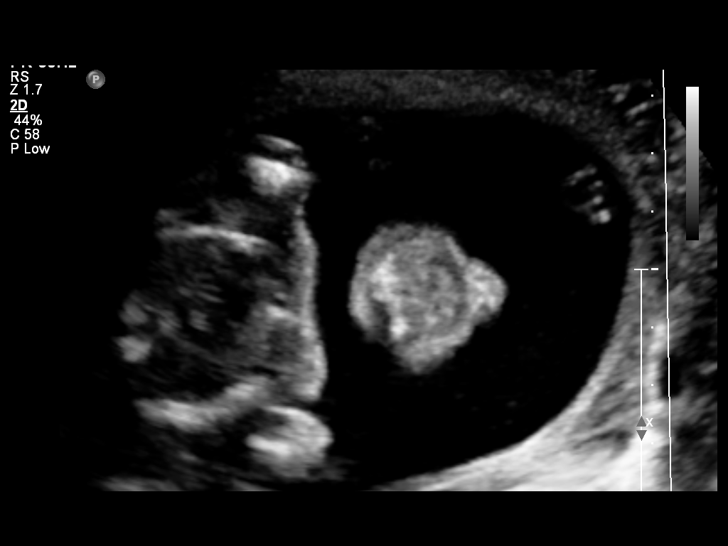
[im 45/84]
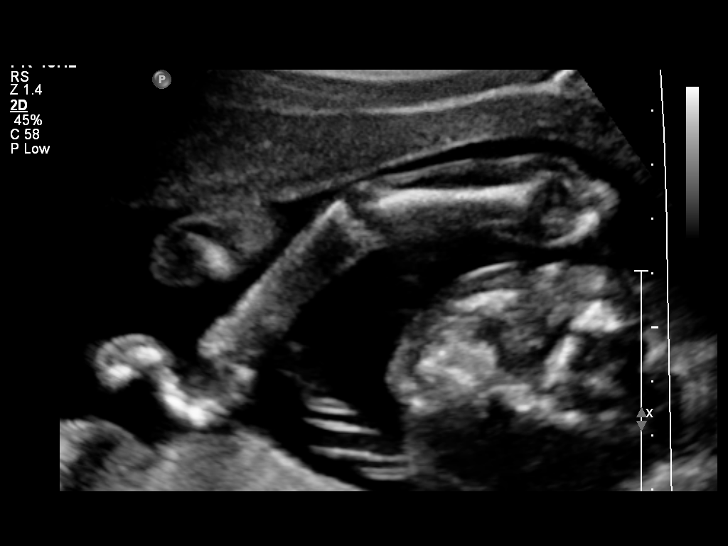
[im 52/84]
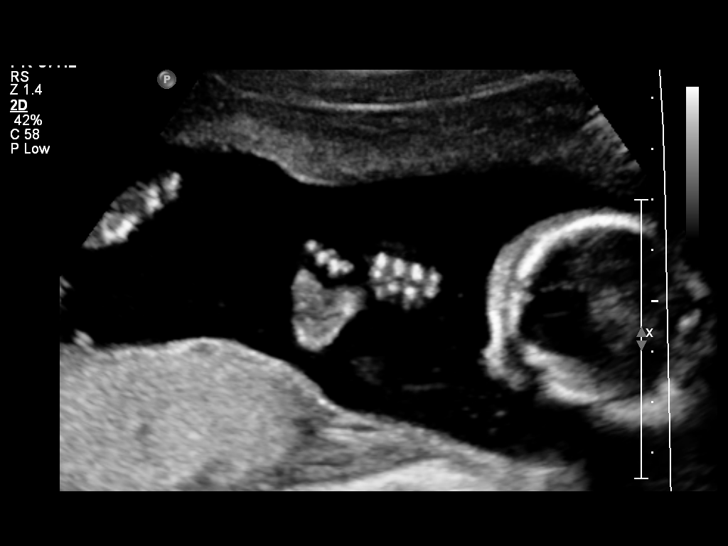
[im 58/84]
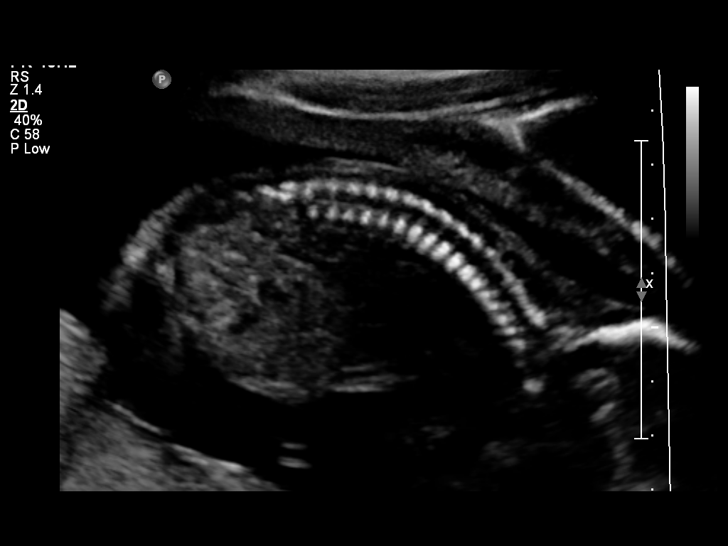
[im 68/84]
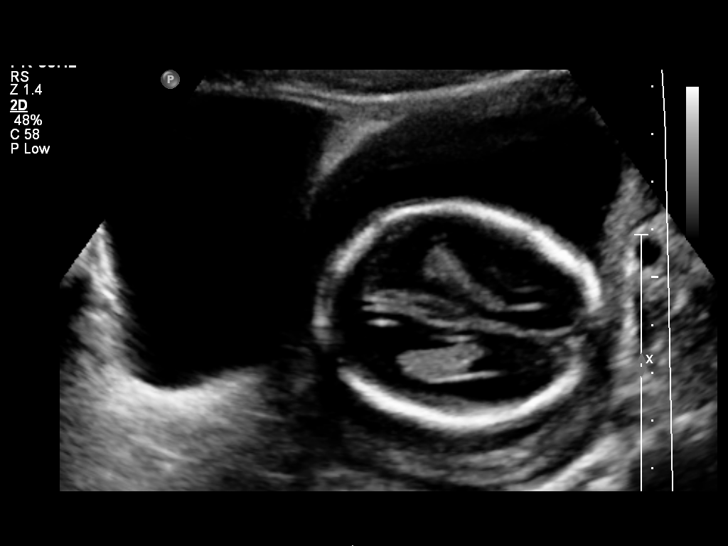
[im 74/84]
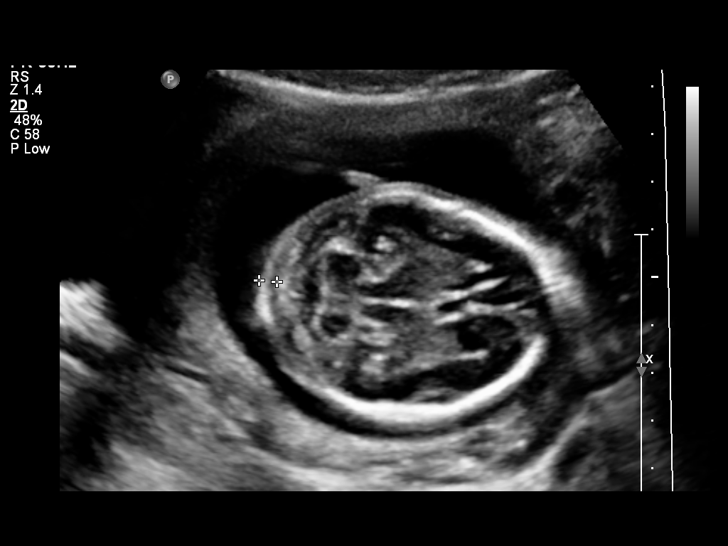
[im 80/84]
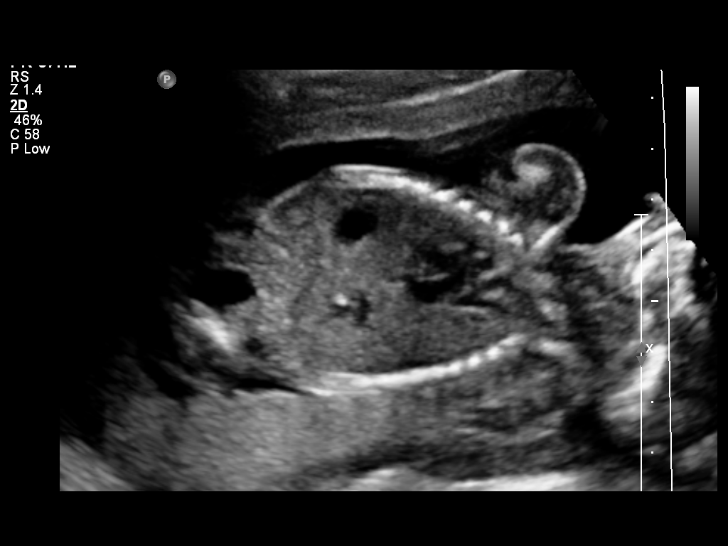

[12 of 28 positions shown; findings below may reference images not displayed]

OBSTETRICS REPORT
                      (Signed Final 05/26/2013 [DATE])

Service(s) Provided

 US OB DETAIL + 14 WK                                  76811.0
Indications

 Detailed fetal anatomic survey
Fetal Evaluation

 Num Of Fetuses:    1
 Fetal Heart Rate:  140                         bpm
 Cardiac Activity:  Observed
 Presentation:      Cephalic
 Placenta:          Posterior, above cervical
                    os
 P. Cord            Visualized
 Insertion:

 Amniotic Fluid
 AFI FV:      Subjectively within normal limits
                                             Larg Pckt:     4.8  cm
Biometry

 BPD:     45.4  mm    G. Age:   19w 5d                CI:        69.46   70 - 86
                                                      FL/HC:      16.0   16.1 -

 HC:     173.9  mm    G. Age:   19w 6d       78  %    HC/AC:      1.18   1.09 -

 AC:     147.4  mm    G. Age:   20w 0d       74  %    FL/BPD:
 FL:      27.9  mm    G. Age:   18w 4d       23  %    FL/AC:      18.9   20 - 24
 HUM:     27.7  mm    G. Age:   18w 6d       43  %

 Est. FW:     291  gm    0 lb 10 oz      50  %
Gestational Age

 U/S Today:     19w 4d                                        EDD:   10/16/13
 Best:          19w 1d    Det. By:   U/S C R L (03/07/13)     EDD:   10/19/13
2nd Trimester Genetic Sonogram - Trisomy 21 Screening

 Age:                                             22          Risk=1:   885

 Structural anomalies (inc. cardiac):             No
 Echogenic bowel:                                 No
 Hypoplastic / absent midphalanx 5th Digit:       No
 Wide space 7st-0nd toes:                         N/A
 Pyelectasis:                                     No
 2-vessel umbilical cord:                         No
 Echogenic cardiac foci:                          No
Anatomy

 Cranium:          Appears normal         Aortic Arch:      Appears normal
 Fetal Cavum:      Appears normal         Ductal Arch:      Appears normal
 Ventricles:       Appears normal         Diaphragm:        Appears normal
 Choroid Plexus:   Appears normal         Stomach:          Appears normal, left
                                                            sided
 Cerebellum:       Appears normal         Abdomen:          Appears normal
 Posterior Fossa:  Appears normal         Abdominal Wall:   Appears nml (cord
                                                            insert, abd wall)
 Nuchal Fold:      Appears normal         Cord Vessels:     Appears normal (3
                                                            vessel cord)
 Face:             Appears normal         Kidneys:          Appear normal
                   (orbits and profile)
 Lips:             Appears normal         Bladder:          Appears normal
 Heart:            Appears normal         Spine:            Appears normal
                   (4CH, axis, and
                   situs)
 RVOT:             Appears normal         Lower             Appears normal
                                          Extremities:
 LVOT:             Appears normal         Upper             Appears normal
                                          Extremities:

 Other:  Male gender. Heels and 5th digit visualized.
Cervix Uterus Adnexa

 Cervical Length:   3         cm

 Cervix:       Normal appearance by transabdominal scan.

 Left Ovary:   Within normal limits.
 Right Ovary:  Not visualized.
 Adnexa:     No abnormality visualized.
Impression

 Siup demonstrating an EGA by ultrasound of 19w 4d. This
 corresponds well with expected EGA by early ultrasound of
 19w 1d.

 No focal fetal or placental abnormalities are noted with a
 good anatomic evaluation possible. No soft markers for Down
 Syndrome are seen.  Correlation with other aneuploidy
 screening results, if available, would be recommended for a
 more complete risk assessment.

 Subjectively and quantitatively normal amniotic fluid volume.
 Normal cervical length.

## 2014-08-13 ENCOUNTER — Encounter (HOSPITAL_COMMUNITY): Payer: Self-pay | Admitting: *Deleted

## 2014-12-01 ENCOUNTER — Inpatient Hospital Stay (HOSPITAL_COMMUNITY)
Admission: AD | Admit: 2014-12-01 | Discharge: 2014-12-01 | Disposition: A | Discharge status: Home / Self Care | Payer: Medicaid Other | Source: Ambulatory Visit | Attending: Obstetrics and Gynecology | Admitting: Obstetrics and Gynecology

## 2014-12-01 ENCOUNTER — Encounter (HOSPITAL_COMMUNITY): Payer: Self-pay

## 2014-12-01 DIAGNOSIS — R112 Nausea with vomiting, unspecified: Secondary | ICD-10-CM | POA: Diagnosis not present

## 2014-12-01 DIAGNOSIS — O219 Vomiting of pregnancy, unspecified: Secondary | ICD-10-CM

## 2014-12-01 DIAGNOSIS — Z3201 Encounter for pregnancy test, result positive: Secondary | ICD-10-CM | POA: Diagnosis not present

## 2014-12-01 DIAGNOSIS — Z3200 Encounter for pregnancy test, result unknown: Secondary | ICD-10-CM | POA: Diagnosis present

## 2014-12-01 DIAGNOSIS — Z3A13 13 weeks gestation of pregnancy: Secondary | ICD-10-CM

## 2014-12-01 LAB — URINALYSIS, ROUTINE W REFLEX MICROSCOPIC
Bilirubin Urine: NEGATIVE
Glucose, UA: NEGATIVE mg/dL
Hgb urine dipstick: NEGATIVE
Ketones, ur: NEGATIVE mg/dL
Nitrite: NEGATIVE
PH: 7 (ref 5.0–8.0)
PROTEIN: NEGATIVE mg/dL
Specific Gravity, Urine: 1.025 (ref 1.005–1.030)
Urobilinogen, UA: 0.2 mg/dL (ref 0.0–1.0)

## 2014-12-01 LAB — URINE MICROSCOPIC-ADD ON

## 2014-12-01 NOTE — Discharge Instructions (Signed)
Prenatal Care  WHAT IS PRENATAL CARE?  Prenatal care means health care during your pregnancy, before your baby is born. It is very important to take care of yourself and your baby during your pregnancy by:   Getting early prenatal care. If you know you are pregnant, or think you might be pregnant, call your health care provider as soon as possible. Schedule a visit for a prenatal exam.  Getting regular prenatal care. Follow your health care provider's schedule for blood and other necessary tests. Do not miss appointments.  Doing everything you can to keep yourself and your baby healthy during your pregnancy.  Getting complete care. Prenatal care should include evaluation of the medical, dietary, educational, psychological, and social needs of you and your significant other. The medical and genetic history of your family and the family of your baby's father should be discussed with your health care provider.  Discussing with your health care provider:  Prescription, over-the-counter, and herbal medicines that you take.  Any history of substance abuse, alcohol use, smoking, and illegal drug use.  Any history of domestic abuse and violence.  Immunizations you have received.  Your nutrition and diet.  The amount of exercise you do.  Any environmental and occupational hazards to which you are exposed.  History of sexually transmitted infections for both you and your partner.  Previous pregnancies you have had. WHY IS PRENATAL CARE SO IMPORTANT?  By regularly seeing your health care provider, you help ensure that problems can be identified early so that they can be treated as soon as possible. Other problems might be prevented. Many studies have shown that early and regular prenatal care is important for the health of mothers and their babies.  HOW CAN I TAKE CARE OF MYSELF WHILE I AM PREGNANT?  Here are ways to take care of yourself and your baby:   Start or continue taking your  multivitamin with 400 micrograms (mcg) of folic acid every day.  Get early and regular prenatal care. It is very important to see a health care provider during your pregnancy. Your health care provider will check at each visit to make sure that you and your baby are healthy. If there are any problems, action can be taken right away to help you and your baby.  Eat a healthy diet that includes:  Fruits.  Vegetables.  Foods low in saturated fat.  Whole grains.  Calcium-rich foods, such as milk, yogurt, and hard cheeses.  Drink 6-8 glasses of liquids a day.  Unless your health care provider tells you not to, try to be physically active for 30 minutes, most days of the week. If you are pressed for time, you can get your activity in through 10-minute segments, three times a day.  Do not smoke, drink alcohol, or use drugs. These can cause long-term damage to your baby. Talk with your health care provider about steps to take to stop smoking. Talk with a member of your faith community, a counselor, a trusted friend, or your health care provider if you are concerned about your alcohol or drug use.  Ask your health care provider before taking any medicine, even over-the-counter medicines. Some medicines are not safe to take during pregnancy.  Get plenty of rest and sleep.  Avoid hot tubs and saunas during pregnancy.  Do not have X-rays taken unless absolutely necessary and with the recommendation of your health care provider. A lead shield can be placed on your abdomen to protect your baby when   X-rays are taken in other parts of your body.  Do not empty the cat litter when you are pregnant. It may contain a parasite that causes an infection called toxoplasmosis, which can cause birth defects. Also, use gloves when working in garden areas used by cats.  Do not eat uncooked or undercooked meats or fish.  Do not eat soft, mold-ripened cheeses (Brie, Camembert, and chevre) or soft, blue-veined  cheese (Danish blue and Roquefort).  Stay away from toxic chemicals like:  Insecticides.  Solvents (some cleaners or paint thinners).  Lead.  Mercury.  Sexual intercourse may continue until the end of the pregnancy, unless you have a medical problem or there is a problem with the pregnancy and your health care provider tells you not to.  Do not wear high-heel shoes, especially during the second half of the pregnancy. You can lose your balance and fall.  Do not take long trips, unless absolutely necessary. Be sure to see your health care provider before going on the trip.  Do not sit in one position for more than 2 hours when on a trip.  Take a copy of your medical records when going on a trip. Know where a hospital is located in the city you are visiting, in case of an emergency.  Most dangerous household products will have pregnancy warnings on their labels. Ask your health care provider about products if you are unsure.  Limit or eliminate your caffeine intake from coffee, tea, sodas, medicines, and chocolate.  Many women continue working through pregnancy. Staying active might help you stay healthier. If you have a question about the safety or the hours you work at your particular job, talk with your health care provider.  Get informed:  Read books.  Watch videos.  Go to childbirth classes for you and your significant other.  Talk with experienced moms.  Ask your health care provider about childbirth education classes for you and your partner. Classes can help you and your partner prepare for the birth of your baby.  Ask about a baby doctor (pediatrician) and methods and pain medicine for labor, delivery, and possible cesarean delivery. HOW OFTEN SHOULD I SEE MY HEALTH CARE PROVIDER DURING PREGNANCY?  Your health care provider will give you a schedule for your prenatal visits. You will have visits more often as you get closer to the end of your pregnancy. An average  pregnancy lasts about 40 weeks.  A typical schedule includes visiting your health care provider:   About once each month during your first 6 months of pregnancy.  Every 2 weeks during the next 2 months.  Weekly in the last month, until the delivery date. Your health care provider will probably want to see you more often if:  You are older than 35 years.  Your pregnancy is high risk because you have certain health problems or problems with the pregnancy, such as:  Diabetes.  High blood pressure.  The baby is not growing on schedule, according to the dates of the pregnancy. Your health care provider will do special tests to make sure you and your baby are not having any serious problems. WHAT HAPPENS DURING PRENATAL VISITS?   At your first prenatal visit, your health care provider will do a physical exam and talk to you about your health history and the health history of your partner and your family. Your health care provider will be able to tell you what date to expect your baby to be born on.  Your   first physical exam will include checks of your blood pressure, measurements of your height and weight, and an exam of your pelvic organs. Your health care provider will do a Pap test if you have not had one recently and will do cultures of your cervix to make sure there is no infection.  At each prenatal visit, there will be tests of your blood, urine, blood pressure, weight, and the progress of the baby will be checked.  At your later prenatal visits, your health care provider will check how you are doing and how your baby is developing. You may have a number of tests done as your pregnancy progresses.  Ultrasound exams are often used to check on your baby's growth and health.  You may have more urine and blood tests, as well as special tests, if needed. These may include amniocentesis to examine fluid in the pregnancy sac, stress tests to check how the baby responds to contractions, or a  biophysical profile to measure your baby's well-being. Your health care provider will explain the tests and why they are necessary.  You should be tested for high blood sugar (gestational diabetes) between the 24th and 28th weeks of your pregnancy.  You should discuss with your health care provider your plans to breastfeed or bottle-feed your baby.  Each visit is also a chance for you to learn about staying healthy during pregnancy and to ask questions. Document Released: 10/01/2003 Document Revised: 10/03/2013 Document Reviewed: 12/13/2013 [employer] Patient Information 2015 [employer]. This information is not intended to replace advice given to you by your health care provider. Make sure you discuss any questions you have with your health care provider.  

## 2014-12-01 NOTE — MAU Provider Note (Signed)
History     CSN: [id number]  Arrival date and time: 12/01/14 1620   First Provider Initiated Contact with Patient 12/01/14 1707      Chief Complaint  Patient presents with  . Morning Sickness   HPI Shelly Tate 23 y.o. G2P1001  presents to MAU with complaint of nausea and vomiting and she desires a pregnancy verification.  She has not had confirmatory pregnancy test but no period in 4 months, enlarged lower abdomen and feeling fetal movement.  She had positive fetal heart tones on triage.  She has had nausea and vomiting x 3 weeks.  She is not presently nauseous and she typically vomits once daily in the morning.  She does not desire treatment for this.  She simply wants pregnancy verification.  She denies vaginal bleeding/discharge, LOF, abdominal pain, weakness, fever.   OB History    Gravida Para Term Preterm AB TAB SAB Ectopic Multiple Living   Past Medical History  Diagnosis Date  . Medical history non-contributory     Past Surgical History  Procedure Laterality Date  . No past surgeries      Family History  Problem Relation Age of Onset  . Alcohol abuse Neg Hx   . Arthritis Neg Hx   . Asthma Neg Hx   . Birth defects Neg Hx   . Cancer Neg Hx   . COPD Neg Hx   . Depression Neg Hx   . Diabetes Neg Hx   . Drug abuse Neg Hx   . Early death Neg Hx   . Hearing loss Neg Hx   . Heart disease Neg Hx   . Hyperlipidemia Neg Hx   . Hypertension Neg Hx   . Kidney disease Neg Hx   . Learning disabilities Neg Hx   . Mental illness Neg Hx   . Mental retardation Neg Hx   . Miscarriages / Stillbirths Neg Hx   . Vision loss Neg Hx     History  Substance Use Topics  . Smoking status: Never Smoker   . Smokeless tobacco: Never Used  . Alcohol Use: No    Allergies: No Known Allergies  Prescriptions prior to admission  Medication Sig Dispense Refill Last Dose  . permethrin (ACTICIN) 5 % cream Apply 1 application topically once. Apply from  the scalp to the bottoms of the feet.  Bathe to remove the cream after 8-12 hours. (Patient not taking: Reported on 12/01/2014) 60 g 0     ROS Pertinent ROS in HPI  Physical Exam   Blood pressure 122/71, pulse 82, temperature 97.7 F (36.5 C), temperature source Oral, resp. rate 16, height  (1.6 m), weight 125 lb 6 oz (56.87 kg), last menstrual period 09/01/2014, not currently breastfeeding.  Physical Exam  Constitutional: She is oriented to person, place, and time. She appears well-developed and well-nourished. No distress.  HENT:  Head: Normocephalic and atraumatic.  Eyes: EOM are normal.  Neck: Normal range of motion.  Cardiovascular: Normal rate and regular rhythm.   Respiratory: Effort normal and breath sounds normal. No respiratory distress.  GI: Soft. Bowel sounds are normal. She exhibits no distension. There is no tenderness. There is no rebound.  Fundus sized to ?14 weeks?  Musculoskeletal: Normal range of motion.  Neurological: She is alert and oriented to person, place, and time.  Skin: Skin is warm and dry.  Psychiatric: She has a normal mood and affect.  MAU Course  Procedures  MDM Pregnancy verified with fetal heart tones.  Dating based on LMP.    Assessment and Plan  A:  1. Encounter for pregnancy test, result positive    P[facility]  Pt plans to go to [facility]. [facility] Patient may return to MAU as needed or if her condition were to change or worsen   Bertram Denvereague Clark, Karen E 12/01/2014, 5:08 PM

## 2014-12-01 NOTE — MAU Note (Signed)
Pt states no menses x4 months, LMP roughly end of November. Here for nausea and vomiting.

## 2014-12-14 LAB — OB RESULTS CONSOLE ABO/RH: RH TYPE: POSITIVE

## 2014-12-14 LAB — OB RESULTS CONSOLE ANTIBODY SCREEN: ANTIBODY SCREEN: NEGATIVE

## 2014-12-14 LAB — OB RESULTS CONSOLE HIV ANTIBODY (ROUTINE TESTING): HIV: NONREACTIVE

## 2014-12-14 LAB — OB RESULTS CONSOLE RPR: RPR: NONREACTIVE

## 2014-12-14 LAB — OB RESULTS CONSOLE HEPATITIS B SURFACE ANTIGEN: Hepatitis B Surface Ag: NEGATIVE

## 2014-12-14 LAB — OB RESULTS CONSOLE RUBELLA ANTIBODY, IGM: Rubella: IMMUNE

## 2015-02-06 DIAGNOSIS — R87619 Unspecified abnormal cytological findings in specimens from cervix uteri: Secondary | ICD-10-CM | POA: Insufficient documentation

## 2015-02-20 DIAGNOSIS — B009 Herpesviral infection, unspecified: Secondary | ICD-10-CM | POA: Insufficient documentation

## 2015-03-17 ENCOUNTER — Inpatient Hospital Stay (HOSPITAL_COMMUNITY)
Admission: AD | Admit: 2015-03-17 | Discharge: 2015-03-17 | Disposition: A | Discharge status: Home / Self Care | Payer: Medicaid Other | Source: Ambulatory Visit | Attending: Obstetrics & Gynecology | Admitting: Obstetrics & Gynecology

## 2015-03-17 ENCOUNTER — Encounter (HOSPITAL_COMMUNITY): Payer: Self-pay | Admitting: *Deleted

## 2015-03-17 DIAGNOSIS — A5901 Trichomonal vulvovaginitis: Secondary | ICD-10-CM | POA: Insufficient documentation

## 2015-03-17 DIAGNOSIS — B373 Candidiasis of vulva and vagina: Secondary | ICD-10-CM | POA: Diagnosis not present

## 2015-03-17 DIAGNOSIS — Z3A34 34 weeks gestation of pregnancy: Secondary | ICD-10-CM | POA: Diagnosis not present

## 2015-03-17 DIAGNOSIS — B3731 Acute candidiasis of vulva and vagina: Secondary | ICD-10-CM

## 2015-03-17 DIAGNOSIS — O0933 Supervision of pregnancy with insufficient antenatal care, third trimester: Secondary | ICD-10-CM | POA: Diagnosis not present

## 2015-03-17 DIAGNOSIS — A599 Trichomoniasis, unspecified: Secondary | ICD-10-CM

## 2015-03-17 DIAGNOSIS — O98313 Other infections with a predominantly sexual mode of transmission complicating pregnancy, third trimester: Secondary | ICD-10-CM | POA: Insufficient documentation

## 2015-03-17 DIAGNOSIS — O98813 Other maternal infectious and parasitic diseases complicating pregnancy, third trimester: Secondary | ICD-10-CM | POA: Diagnosis not present

## 2015-03-17 DIAGNOSIS — O9989 Other specified diseases and conditions complicating pregnancy, childbirth and the puerperium: Secondary | ICD-10-CM | POA: Diagnosis present

## 2015-03-17 LAB — URINALYSIS, ROUTINE W REFLEX MICROSCOPIC
Bilirubin Urine: NEGATIVE
Glucose, UA: NEGATIVE mg/dL
Hgb urine dipstick: NEGATIVE
Ketones, ur: NEGATIVE mg/dL
Nitrite: NEGATIVE
Protein, ur: NEGATIVE mg/dL
SPECIFIC GRAVITY, URINE: 1.025 (ref 1.005–1.030)
Urobilinogen, UA: 0.2 mg/dL (ref 0.0–1.0)
pH: 7 (ref 5.0–8.0)

## 2015-03-17 LAB — WET PREP, GENITAL: CLUE CELLS WET PREP: NONE SEEN

## 2015-03-17 LAB — URINE MICROSCOPIC-ADD ON

## 2015-03-17 LAB — POCT FERN TEST: POCT FERN TEST: NEGATIVE

## 2015-03-17 MED ORDER — FLUCONAZOLE 150 MG PO TABS: 150.0000 mg | ORAL_TABLET | Freq: Every day | ORAL | Status: DC

## 2015-03-17 MED ORDER — METRONIDAZOLE 500 MG PO TABS: 2000.0000 mg | ORAL_TABLET | Freq: Once | ORAL | Status: DC

## 2015-03-17 NOTE — Discharge Instructions (Signed)
Preterm Labor Information Preterm labor is when labor starts before you are [redacted] weeks pregnant. The normal length of pregnancy is 39 to 41 weeks.  CAUSES  The cause of preterm labor is not often known. The most common known cause is infection. RISK FACTORS  Having a history of preterm labor.  Having your water break before it should.  Having a placenta that covers the opening of the cervix.  Having a placenta that breaks away from the uterus.  Having a cervix that is too weak to hold the baby in the uterus.  Having too much fluid in the amniotic sac.  Taking drugs or smoking while pregnant.  Not gaining enough weight while pregnant.  Being younger than 6618 and older than 22 years old.  Having a low income.  Being African American. SYMPTOMS  Period-like cramps, belly (abdominal) pain, or back pain.  Contractions that are regular, as often as six in an hour. They may be mild or painful.  Contractions that start at the top of the belly. They then move to the lower belly and back.  Lower belly pressure that seems to get stronger.  Bleeding from the vagina.  Fluid leaking from the vagina. TREATMENT  Treatment depends on:  Your condition.  The condition of your baby.  How many weeks pregnant you are. Your doctor may have you:  Take medicine to stop contractions.  Stay in bed except to use the restroom (bed rest).  Stay in the hospital. WHAT SHOULD YOU DO IF YOU THINK YOU ARE IN PRETERM LABOR? Call your doctor right away. You need to go to the hospital right away.  HOW CAN YOU PREVENT PRETERM LABOR IN FUTURE PREGNANCIES?  Stop smoking, if you smoke.  Maintain healthy weight gain.  Do not take drugs or be around chemicals that are not needed.  Tell your doctor if you think you have an infection.  Tell your doctor if you had a preterm labor before. Document Released: 12/25/2008 Document Revised: 07/19/2013 Document Reviewed: 12/25/2008 [facility] Patient  Information 2015 [place]. This information is not intended to replace advice given to you by your health care provider. Make sure you discuss any questions you have with your health care provider.  Trichomoniasis Trichomoniasis is an infection caused by an organism called Trichomonas. The infection can affect both women and men. In women, the outer female genitalia and the vagina are affected. In men, the penis is mainly affected, but the prostate and other reproductive organs can also be involved. Trichomoniasis is a sexually transmitted infection (STI) and is most often passed to another person through sexual contact.  RISK FACTORS  Having unprotected sexual intercourse.  Having sexual intercourse with an infected partner. SIGNS AND SYMPTOMS  Symptoms of trichomoniasis in women include:  Abnormal gray-green frothy vaginal discharge.  Itching and irritation of the vagina.  Itching and irritation of the area outside the vagina. Symptoms of trichomoniasis in men include:   Penile discharge with or without pain.  Pain during urination. This results from inflammation of the urethra. DIAGNOSIS  Trichomoniasis may be found during a Pap test or physical exam. Your health care provider may use one of the following methods to help diagnose this infection:  Examining vaginal discharge under a microscope. For men, urethral discharge would be examined.  Testing the pH of the vagina with a test tape.  Using a vaginal swab test that checks for the Trichomonas organism. A test is available that provides results within a few minutes.  Doing a culture test for the organism. This is not usually needed. TREATMENT   You may be given medicine to fight the infection. Women should inform their health care provider if they could be or are pregnant. Some medicines used to treat the infection should not be taken during pregnancy.  Your health care provider may recommend over-the-counter medicines  or creams to decrease itching or irritation.  Your sexual partner will need to be treated if infected. HOME CARE INSTRUCTIONS   Take medicines only as directed by your health care provider.  Take over-the-counter medicine for itching or irritation as directed by your health care provider.  Do not have sexual intercourse while you have the infection.  Women should not douche or wear tampons while they have the infection.  Discuss your infection with your partner. Your partner may have gotten the infection from you, or you may have gotten it from your partner.  Have your sex partner get examined and treated if necessary.  Practice safe, informed, and protected sex.  See your health care provider for other STI testing. SEEK MEDICAL CARE IF:   You still have symptoms after you finish your medicine.  You develop abdominal pain.  You have pain when you urinate.  You have bleeding after sexual intercourse.  You develop a rash.  Your medicine makes you sick or makes you throw up (vomit). MAKE SURE YOU:  Understand these instructions.  Will watch your condition.  Will get help right away if you are not doing well or get worse. Document Released: 03/24/2001 Document Revised: 02/12/2014 Document Reviewed: 07/10/2013 [facility] Patient Information 2015 [place], [place]. This information is not intended to replace advice given to you by your health care provider. Make sure you discuss any questions you have with your health care provider.

## 2015-03-17 NOTE — Progress Notes (Signed)
1630 pt's family states pt is really leaking a lot of fluid and asks for a pad.

## 2015-03-17 NOTE — MAU Provider Note (Signed)
History     CSN: [id number]  Arrival date and time: 03/17/15 1544   None     No chief complaint on file.  HPI [name] is a 24 y.o. G2P1001 at 2232w3d who presents with complaint of leaking fluid for the last three days. She is getting [place] prenatal care in [place]. 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 (Outside records on Care Everywhere).   Patient reports that she has noticed leaking of thin, white vaginal fluid over the last three days. She is worried she might have water broken - her water was broken in her last pregnancy for several days and she didn't realize it. She denies any vaginal bleeding. She continues to feel regular fetal movement. Hasn't had any abdominal pain or contractions. No vaginal irritation or itching. Has not had intercourse or anything in the vaginal for two months. No urinary symptoms.   OB History    Gravida Para Term Preterm AB TAB SAB Ectopic Multiple Living   2 1 1       1       Past Medical History  Diagnosis Date  . Medical history non-contributory     Past Surgical History  Procedure Laterality Date  . No past surgeries      Family History  Problem Relation Age of Onset  . Alcohol abuse Neg Hx   . Arthritis Neg Hx   . Asthma Neg Hx   . Birth defects Neg Hx   . Cancer Neg Hx   . COPD Neg Hx   . Depression Neg Hx   . Diabetes Neg Hx   . Drug abuse Neg Hx   . Early death Neg Hx   . Hearing loss Neg Hx   . Heart disease Neg Hx   . Hyperlipidemia Neg Hx   . Hypertension Neg Hx   . Kidney disease Neg Hx   . Learning disabilities Neg Hx   . Mental illness Neg Hx   . Mental retardation Neg Hx   . Miscarriages / Stillbirths Neg Hx   . Vision loss Neg Hx     History  Substance Use Topics  . Smoking status: Never Smoker   . Smokeless tobacco: Never Used  . Alcohol Use: No    Allergies: No Known Allergies  Prescriptions prior to  admission  Medication Sig Dispense Refill Last Dose  . Prenatal Vit-Fe Fumarate-FA (PRENATAL MULTIVITAMIN) TABS tablet Take 1 tablet by mouth daily.   03/17/2015 at Unknown time    Review of Systems  Constitutional: Negative for fever and chills.  Genitourinary: Negative for dysuria, urgency and hematuria.  Neurological: Negative for headaches.   Physical Exam   Blood pressure 122/82, pulse 116, temperature 98.3 F (36.8 C), temperature source Oral, resp. rate 20, height 5\' 2"  (1.575 m), weight 72.122 kg (159 lb), last menstrual period 09/01/2014, not currently breastfeeding.  Physical Exam  Constitutional: She appears well-developed and well-nourished.  GI: Soft.  Genitourinary:  External genitalia normal. Vaginal vault with moderate amount of white discharge. No pooling. Cervix visually closed. No leakage of fluid with cough or bearing down.  SVE with external os open, internal os closed, long, high   Dilation: Closed Effacement (%): Thick Exam by:: MD  MAU Course  Procedures  MDM 24 y.o. G2P1001 at 6132w3d with complaint of leaking of fluid. No contractions on history of monitoring. FHT Cat I. Exam without any leakage of fluid,  no pooling, no LOF with valsalva. Ferning is negative. Wet prep with trichomonas and yeast infection. Urinalysis with large LE but negative nitrite and many squamous epithelial cells. 7-10 WBC on urinalysis. Given lack of urinary symptoms, will treat underlying trichomonas and yeast infection. Patient to watch for signs of urinary infection.   Assessment and Plan  A: 24 y.o. G2P1001 at [redacted]w[redacted]d with complaint of leaking of fluid. Evaluation for ROM negative. FHT reassuring.  Found to have trichomonas and yeast infection  P:  Preterm labor precautions given  Patient to f/u with her primary OB in clinic this week  Metronidazole 2 g x 1 Diflucan 150 mg x 1   Patient history, exam, assessment and plan discussed with Zerita Boers, CNM  Fabio Asa 03/17/2015, 5:01 PM

## 2015-03-17 NOTE — Progress Notes (Signed)
1650 in to see pt. Evaluating perineum. Pt talking on phone. Pt states she really is leaking and states "see" and clear gush noted. Pt's perinuem appears to tighten at this time.

## 2015-03-17 NOTE — MAU Note (Signed)
Pt presents to MAU with complaints of leakage of fluid all week.Reports lower abdominal pain. Pt states that [facility] [place].

## 2015-03-18 LAB — GC/CHLAMYDIA PROBE AMP (~~LOC~~) NOT AT ARMC
Chlamydia: NEGATIVE
Neisseria Gonorrhea: NEGATIVE

## 2015-10-06 ENCOUNTER — Encounter (HOSPITAL_COMMUNITY): Payer: Self-pay | Admitting: *Deleted

## 2016-07-07 ENCOUNTER — Encounter (HOSPITAL_COMMUNITY): Payer: Self-pay

## 2016-07-07 ENCOUNTER — Emergency Department (HOSPITAL_COMMUNITY)
Admission: EM | Admit: 2016-07-07 | Discharge: 2016-07-07 | Disposition: A | Discharge status: Home / Self Care | Payer: Medicaid Other | Attending: Emergency Medicine | Admitting: Emergency Medicine

## 2016-07-07 DIAGNOSIS — Y999 Unspecified external cause status: Secondary | ICD-10-CM | POA: Diagnosis not present

## 2016-07-07 DIAGNOSIS — Z202 Contact with and (suspected) exposure to infections with a predominantly sexual mode of transmission: Secondary | ICD-10-CM

## 2016-07-07 DIAGNOSIS — K0889 Other specified disorders of teeth and supporting structures: Secondary | ICD-10-CM

## 2016-07-07 DIAGNOSIS — Y929 Unspecified place or not applicable: Secondary | ICD-10-CM | POA: Insufficient documentation

## 2016-07-07 DIAGNOSIS — Y939 Activity, unspecified: Secondary | ICD-10-CM | POA: Insufficient documentation

## 2016-07-07 DIAGNOSIS — W108XXA Fall (on) (from) other stairs and steps, initial encounter: Secondary | ICD-10-CM | POA: Diagnosis not present

## 2016-07-07 DIAGNOSIS — S025XXA Fracture of tooth (traumatic), initial encounter for closed fracture: Secondary | ICD-10-CM | POA: Diagnosis not present

## 2016-07-07 MED ORDER — AZITHROMYCIN 500 MG PO TABS
1000.0000 mg | ORAL_TABLET | Freq: Once | ORAL | Status: AC
  Administered 2016-07-07: 1000 mg via ORAL
  Filled 2016-07-07: qty 4
  Filled 2016-07-07: qty 2

## 2016-07-07 MED ORDER — LIDOCAINE HCL (PF) 1 % IJ SOLN
INTRAMUSCULAR | Status: AC
Start: 2016-07-07 — End: 2016-07-07
  Administered 2016-07-07: 0.9 mL
  Filled 2016-07-07: qty 5

## 2016-07-07 MED ORDER — CEFTRIAXONE SODIUM 250 MG IJ SOLR
250.0000 mg | Freq: Once | INTRAMUSCULAR | Status: AC
  Administered 2016-07-07: 250 mg via INTRAMUSCULAR
  Filled 2016-07-07: qty 250

## 2016-07-07 MED ORDER — NAPROXEN 250 MG PO TABS
500.0000 mg | ORAL_TABLET | Freq: Once | ORAL | Status: AC
  Administered 2016-07-07: 500 mg via ORAL
  Filled 2016-07-07: qty 2

## 2016-07-07 NOTE — ED Notes (Signed)
Called pt back to Triage and unable to find.

## 2016-07-07 NOTE — ED Notes (Signed)
Pt provided with d/c instructions at this time. Pt verbalizes understanding of d/c instructions as well as follow up procedure after d/c. No new RX at time of d/c. Pt provided with a work note at time of d/c.  Pt in no apparent distress at this time. Pt ambulatory at time of d/c.

## 2016-07-07 NOTE — ED Provider Notes (Signed)
[facility]EMER[facility]ENCY DEPT Provider Note   CSN: [id number] Arrival date & time: 07/07/16  1511     History   Chief Complaint Chief Complaint  Patient presents with  . Exposure to STD  . Dental Pain    HPI Shelly Tate is a 25 y.o. female.  The history is provided by the patient.  Illness  This is a new problem. The current episode started yesterday (states her sexual partner told her yesterday he has Chlamydia). The problem occurs constantly. The problem has not changed since onset.Pertinent negatives include no abdominal pain. She has tried nothing for the symptoms.    Past Medical History:  Diagnosis Date  . Medical history non-contributory     Patient Active Problem List   Diagnosis Date Noted  . Normal labor 10/21/2013  . Normal delivery 10/21/2013    Past Surgical History:  Procedure Laterality Date  . NO PAST SURGERIES      OB History    Gravida Para Term Preterm AB Living   2 1 1     1    SAB TAB Ectopic Multiple Live Births           1       Home Medications    Prior to Admission medications   Medication Sig Start Date End Date Taking? Authorizing Provider  fluconazole (DIFLUCAN) 150 MG tablet Take 1 tablet (150 mg total) by mouth daily. 03/17/15   Fabio Asa, MD  metroNIDAZOLE (FLAGYL) 500 MG tablet Take 4 tablets (2,000 mg total) by mouth once. 03/17/15   Fabio Asa, MD  Prenatal Vit-Fe Fumarate-FA (PRENATAL MULTIVITAMIN) TABS tablet Take 1 tablet by mouth daily.    Historical Provider, MD    Family History Family History  Problem Relation Age of Onset  . Alcohol abuse Neg Hx   . Arthritis Neg Hx   . Asthma Neg Hx   . Birth defects Neg Hx   . Cancer Neg Hx   . COPD Neg Hx   . Depression Neg Hx   . Diabetes Neg Hx   . Drug abuse Neg Hx   . Early death Neg Hx   . Hearing loss Neg Hx   . Heart disease Neg Hx   . Hyperlipidemia Neg Hx   . Hypertension Neg Hx   . Kidney disease Neg Hx   . Learning disabilities Neg Hx   . Mental  illness Neg Hx   . Mental retardation Neg Hx   . Miscarriages / Stillbirths Neg Hx   . Vision loss Neg Hx     Social History Social History  Substance Use Topics  . Smoking status: Never Smoker  . Smokeless tobacco: Never Used  . Alcohol use No     Allergies   Review of patient's allergies indicates no known allergies.   Review of Systems Review of Systems  Constitutional: Negative for chills, fatigue and fever.  HENT:       C/o fracture of right central incisor that occurred yesterday, no trauma. Old fracture of left central incisor 4 months ago, undergoing outpatient evaluation  Gastrointestinal: Negative for abdominal pain.  Genitourinary: Negative for dysuria and flank pain.  Musculoskeletal: Negative for arthralgias and myalgias.  Skin: Negative for rash.  All other systems reviewed and are negative.    Physical Exam Updated Vital Signs BP (!) 122/49   Pulse (!) 50   Temp 97.9 F (36.6 C) (Oral)   Resp 18   LMP 07/07/2016 (Exact Date)   SpO2 100%  Physical Exam  Constitutional: She is oriented to person, place, and time. She appears well-developed and well-nourished. No distress.  Pleasant, cooperative, well-appearing  HENT:  Head: Normocephalic and atraumatic.  Mouth/Throat: Oropharynx is clear and moist.    Ellis type II fx of right central incisor (new yesterday), Ellis type III fx of left central incisor (x 4 months). No dental abscess, no caries of the front fractured teeth. No intrusion or instability of root  Eyes: Conjunctivae are normal. No scleral icterus.  Neck: Normal range of motion. Neck supple.  Cardiovascular: Normal rate and intact distal pulses.   Pulmonary/Chest: Effort normal.  Abdominal: Soft. She exhibits no distension. There is no tenderness.  Musculoskeletal: Normal range of motion. She exhibits no edema or tenderness.  Neurological: She is alert and oriented to person, place, and time. She exhibits normal muscle tone.  Coordination normal.  Skin: Skin is warm and dry. Capillary refill takes less than 2 seconds. No rash noted. She is not diaphoretic.  Psychiatric: She has a normal mood and affect.  Nursing note and vitals reviewed.    ED Treatments / Results  Labs (all labs ordered are listed, but only abnormal results are displayed) Labs Reviewed  RPR  HIV ANTIBODY (ROUTINE TESTING)  GC/CHLAMYDIA PROBE AMP ([place]) NOT AT [facility]    EKG  EKG Interpretation None       Radiology No results found.  Procedures Procedures (including critical care time)  Medications Ordered in ED Medications  cefTRIAXone (ROCEPHIN) injection 250 mg (250 mg Intramuscular Given 07/07/16 2120)  azithromycin (ZITHROMAX) tablet 1,000 mg (1,000 mg Oral Given 07/07/16 2120)  naproxen (NAPROSYN) tablet 500 mg (500 mg Oral Given 07/07/16 2120)  lidocaine (PF) (XYLOCAINE) 1 % injection (0.9 mLs  Given 07/07/16 2121)     Initial Impression / Assessment and Plan / ED Course  I have reviewed the triage vital signs and the nursing notes.  Pertinent labs & imaging results that were available during my care of the patient were reviewed by me and considered in my medical decision making (see chart for details).  Clinical Course   Shelly Tate is a 25 y.o. female who denies h/o prior STD's who presents to ED for evaluation of STD exposure. States her female sexual partner was seen and treated yesterday for chlamydia. Pt denies abdominal discomfort, denies vaginal discharge. Empirically treated as above. Also tested RPR and for HIV, pt agrees with this plan.   On ROS, pt also c/o new Ellis II fx of right central incisor. Noted to have Rennis HardingEllis III fx of left central incisor that pt states is 4 months old and undergoing outpatient tx at dental clinic and is supposed to have crown placed. Advised to f/u with this dental clinic this week for assessment of both teeth.   Advised to return to ER for any new, worse, or concerning  symptoms. Advised can take ibuprofen for the tooth pain if not pregnant or breastfeeding. She demonstrates understanding of this and comfort with d/c home.  Pt condition, course, and discharge were discussed with attending physician.   Final Clinical Impressions(s) / ED Diagnoses   Final diagnoses:  Exposure to STD  Pain, dental  Fracture of tooth, initial encounter    New Prescriptions Discharge Medication List as of 07/07/2016  9:38 PM       Horald PollenAudrey Sandrea Boer, MD 07/08/16 0102    Doug SouSam Jacubowitz, MD 07/08/16 [id number]

## 2016-07-07 NOTE — ED Triage Notes (Signed)
Pt reports she was exposed to STD. Pt denies symptoms. Pt also reports dental pain that started last night.

## 2016-07-07 NOTE — ED Provider Notes (Signed)
Patient tripped on steps yesterday and chipped tooth #8 as result fall. Tooth is not loose. She complains of mouth pain. She also requests treatment for STDs as she was recently exposed to chlamydia. Patient currently on menses. She is alert awake Glasgow coma score 15 appears in no distress   Doug SouSam Latrica Clowers, MD 07/07/16 2119

## 2016-07-07 NOTE — ED Notes (Signed)
Pt was found sitting in peds waiting area. Pt brought back to triage room.

## 2016-07-08 LAB — GC/CHLAMYDIA PROBE AMP (~~LOC~~) NOT AT ARMC
Chlamydia: NEGATIVE
NEISSERIA GONORRHEA: NEGATIVE

## 2016-07-08 LAB — HIV ANTIBODY (ROUTINE TESTING W REFLEX): HIV Screen 4th Generation wRfx: NONREACTIVE

## 2016-07-08 LAB — RPR: RPR Ser Ql: NONREACTIVE

## 2016-08-02 ENCOUNTER — Inpatient Hospital Stay (HOSPITAL_COMMUNITY)
Admission: AD | Admit: 2016-08-02 | Discharge: 2016-08-02 | Disposition: A | Discharge status: Home / Self Care | Payer: Medicaid Other | Source: Ambulatory Visit | Attending: Obstetrics and Gynecology | Admitting: Obstetrics and Gynecology

## 2016-08-02 ENCOUNTER — Encounter (HOSPITAL_COMMUNITY): Payer: Self-pay | Admitting: *Deleted

## 2016-08-02 DIAGNOSIS — R1031 Right lower quadrant pain: Secondary | ICD-10-CM

## 2016-08-02 DIAGNOSIS — R1032 Left lower quadrant pain: Secondary | ICD-10-CM | POA: Diagnosis not present

## 2016-08-02 DIAGNOSIS — R630 Anorexia: Secondary | ICD-10-CM | POA: Diagnosis not present

## 2016-08-02 DIAGNOSIS — R1030 Lower abdominal pain, unspecified: Secondary | ICD-10-CM | POA: Insufficient documentation

## 2016-08-02 DIAGNOSIS — N912 Amenorrhea, unspecified: Secondary | ICD-10-CM | POA: Diagnosis not present

## 2016-08-02 DIAGNOSIS — F17200 Nicotine dependence, unspecified, uncomplicated: Secondary | ICD-10-CM | POA: Diagnosis not present

## 2016-08-02 LAB — URINALYSIS, ROUTINE W REFLEX MICROSCOPIC
BILIRUBIN URINE: NEGATIVE
GLUCOSE, UA: NEGATIVE mg/dL
HGB URINE DIPSTICK: NEGATIVE
KETONES UR: NEGATIVE mg/dL
Nitrite: NEGATIVE
PROTEIN: NEGATIVE mg/dL
Specific Gravity, Urine: 1.02 (ref 1.005–1.030)
pH: 7 (ref 5.0–8.0)

## 2016-08-02 LAB — WET PREP, GENITAL
Sperm: NONE SEEN
Trich, Wet Prep: NONE SEEN
Yeast Wet Prep HPF POC: NONE SEEN

## 2016-08-02 LAB — POCT PREGNANCY, URINE: PREG TEST UR: NEGATIVE

## 2016-08-02 LAB — URINE MICROSCOPIC-ADD ON: RBC / HPF: NONE SEEN RBC/hpf (ref 0–5)

## 2016-08-02 NOTE — MAU Provider Note (Signed)
History     CSN: [id number]  Arrival date and time: 08/02/16 1320   First Provider Initiated Contact with Patient 08/02/16 1403      Chief Complaint  Patient presents with  . Abdominal Pain  . Amenorrhea   25 y.o. [id number] non-pregnant female c/o lower abdominal cramps x1 week. She describes pain as intermittent, and rates 4/10. She has not used OTC analgesics. She is also concerned that her menses hasn't started this month although it has not quite been 4 weeks yet. She is not using protection and does not desires pregnancy. She denies vaginal discharge, itching, and odor. No new partners x5 years. She reports poor appetite since yesterday.    Pertinent Gynecological History: Menses: 07/07/16 Contraception: none Sexually transmitted diseases: no past history Last pap: normal Date: 2016   Past Medical History:  Diagnosis Date  . Medical history non-contributory     Past Surgical History:  Procedure Laterality Date  . NO PAST SURGERIES      Family History  Problem Relation Age of Onset  . Alcohol abuse Neg Hx   . Arthritis Neg Hx   . Asthma Neg Hx   . Birth defects Neg Hx   . Cancer Neg Hx   . COPD Neg Hx   . Depression Neg Hx   . Diabetes Neg Hx   . Drug abuse Neg Hx   . Early death Neg Hx   . Hearing loss Neg Hx   . Heart disease Neg Hx   . Hyperlipidemia Neg Hx   . Hypertension Neg Hx   . Kidney disease Neg Hx   . Learning disabilities Neg Hx   . Mental illness Neg Hx   . Mental retardation Neg Hx   . Miscarriages / Stillbirths Neg Hx   . Vision loss Neg Hx     Social History  Substance Use Topics  . Smoking status: Current Some Day Smoker  . Smokeless tobacco: Never Used  . Alcohol use No    Allergies: No Known Allergies  No prescriptions prior to admission.    Review of Systems  Constitutional: Negative for fever.  Gastrointestinal: Positive for abdominal pain and nausea. Negative for constipation, diarrhea and vomiting.  Genitourinary:  Negative.    Physical Exam   Blood pressure 134/67, pulse 65, temperature 99.2 F (37.3 C), temperature source Oral, resp. rate 16, height 5\' 2"  (1.575 m), weight 52.6 kg (116 lb), last menstrual period 07/07/2016, unknown if currently breastfeeding.  Physical Exam  Constitutional: She is oriented to person, place, and time. She appears well-developed and well-nourished.  HENT:  Head: Normocephalic and atraumatic.  Neck: Normal range of motion. Neck supple.  Cardiovascular: Normal rate.   Respiratory: Effort normal.  GI: Soft. She exhibits no distension and no mass. There is no tenderness. There is no rebound and no guarding.  Genitourinary:  Genitourinary Comments: External: no lesions Vagina: rugated, parous, small thin white discharge Uterus: non enlarged, anteverted, non tender, no CMT Adnexae: no masses, no tenderness left, no tenderness right   Musculoskeletal: Normal range of motion.  Neurological: She is alert and oriented to person, place, and time.  Skin: Skin is warm and dry.  Psychiatric: She has a normal mood and affect.   Results for orders placed or performed during the hospital encounter of 08/02/16 (from the past 24 hour(s))  Urinalysis, Routine w reflex microscopic (not at [facility])     Status: Abnormal   Collection Time: 08/02/16  1:40 PM  Result Value Ref  Range   Color, Urine YELLOW YELLOW   APPearance CLEAR CLEAR   Specific Gravity, Urine 1.020 1.005 - 1.030   pH 7.0 5.0 - 8.0   Glucose, UA NEGATIVE NEGATIVE mg/dL   Hgb urine dipstick NEGATIVE NEGATIVE   Bilirubin Urine NEGATIVE NEGATIVE   Ketones, ur NEGATIVE NEGATIVE mg/dL   Protein, ur NEGATIVE NEGATIVE mg/dL   Nitrite NEGATIVE NEGATIVE   Leukocytes, UA SMALL (A) NEGATIVE  Urine microscopic-add on     Status: Abnormal   Collection Time: 08/02/16  1:40 PM  Result Value Ref Range   Squamous Epithelial / LPF 6-30 (A) NONE SEEN   WBC, UA 6-30 0 - 5 WBC/hpf   RBC / HPF NONE SEEN 0 - 5 RBC/hpf   Bacteria,  UA RARE (A) NONE SEEN   Urine-Other MUCOUS PRESENT   Pregnancy, urine POC     Status: None   Collection Time: 08/02/16  1:42 PM  Result Value Ref Range   Preg Test, Ur NEGATIVE NEGATIVE  Wet prep, genital     Status: Abnormal   Collection Time: 08/02/16  2:15 PM  Result Value Ref Range   Yeast Wet Prep HPF POC NONE SEEN NONE SEEN   Trich, Wet Prep NONE SEEN NONE SEEN   Clue Cells Wet Prep HPF POC PRESENT (A) NONE SEEN   WBC, Wet Prep HPF POC FEW (A) NONE SEEN   Sperm NONE SEEN     MAU Course  Procedures  MDM Labs ordered and reviewed. No evidence of pregnancy, acute pelvic process, vaginal infection, or UTI. Cramping likely pre-menstrual. Stable for discharge home.   Assessment and Plan   1. Abdominal cramping, bilateral lower quadrant   2. Poor appetite    Discharge home May f/u with [facility] for contraception Ibuprofen prn  Donette Larry, CNM 08/02/2016, 2:04 PM

## 2016-08-02 NOTE — MAU Note (Signed)
Pt states she has been cramping, hasn't had a period x 2 months.  Has been feeling nauseated, very tired, sleeping a lot.  Also thinks she is losing weight.  Hasn't done HPT.

## 2016-08-03 LAB — GC/CHLAMYDIA PROBE AMP (~~LOC~~) NOT AT ARMC
Chlamydia: POSITIVE — AB
Neisseria Gonorrhea: NEGATIVE

## 2016-08-04 ENCOUNTER — Other Ambulatory Visit: Payer: Self-pay | Admitting: Student

## 2016-08-04 ENCOUNTER — Telehealth (HOSPITAL_COMMUNITY): Payer: Self-pay | Admitting: *Deleted

## 2016-08-04 DIAGNOSIS — A749 Chlamydial infection, unspecified: Secondary | ICD-10-CM

## 2016-08-04 MED ORDER — AZITHROMYCIN 500 MG PO TABS: 1000.0000 mg | ORAL_TABLET | Freq: Once | ORAL | 0 refills | Status: AC

## 2016-08-04 NOTE — Telephone Encounter (Signed)
Attempted to call pt. To notify regarding positive results for Chlamydia. No phone or voice mail answered. Perscription sent for Zithromax 1000mg  x1 to pt's. listed pharmacy by Judeth HornErin Lawrence, NP. [place] form completed and [facility].

## 2016-09-04 ENCOUNTER — Encounter (HOSPITAL_COMMUNITY): Payer: Self-pay | Admitting: *Deleted

## 2016-09-04 ENCOUNTER — Inpatient Hospital Stay (HOSPITAL_COMMUNITY)
Admission: AD | Admit: 2016-09-04 | Discharge: 2016-09-04 | Disposition: A | Discharge status: Home / Self Care | Payer: Medicaid Other | Source: Ambulatory Visit | Attending: Obstetrics & Gynecology | Admitting: Obstetrics & Gynecology

## 2016-09-04 DIAGNOSIS — N912 Amenorrhea, unspecified: Secondary | ICD-10-CM

## 2016-09-04 DIAGNOSIS — Z3200 Encounter for pregnancy test, result unknown: Secondary | ICD-10-CM

## 2016-09-04 NOTE — Discharge Instructions (Signed)
Secondary Amenorrhea Secondary amenorrhea is the stopping of menstrual flow for 3-6 months in a female who has previously had periods. There are many possible causes. Most of these causes are not serious. Usually, treating the underlying problem causing the loss of menses will return your periods to normal. What are the causes? Some common and uncommon causes of not menstruating include:  Malnutrition.  Low blood sugar (hypoglycemia).  Polycystic ovary disease.  Stress or fear.  Breastfeeding.  Hormone imbalance.  Ovarian failure.  Medicines.  Extreme obesity.  Cystic fibrosis.  Low body weight or drastic weight reduction from any cause.  Early menopause.  Removal of ovaries or uterus.  Contraceptives.  Illness.  Long-term (chronic) illnesses.  Cushing syndrome.  Thyroid problems.  Birth control pills, patches, or vaginal rings for birth control. What increases the risk? You may be at greater risk of secondary amenorrhea if:  You have a family history of this condition.  You have an eating disorder.  You do athletic training. How is this diagnosed? A diagnosis is made by your health care provider taking a medical history and doing a physical exam. This will include a pelvic exam to check for problems with your reproductive organs. Pregnancy must be ruled out. Often, numerous blood tests are done to measure different hormones in the body. Urine testing may be done. Specialized exams (ultrasound, CT scan, MRI, or hysteroscopy) may have to be done as well as measuring the body mass index (BMI). How is this treated? Treatment depends on the cause of the amenorrhea. If an eating disorder is present, this can be treated with an adequate diet and therapy. Chronic illnesses may improve with treatment of the illness. Amenorrhea may be corrected with medicines, lifestyle changes, or surgery. If the amenorrhea cannot be corrected, it is sometimes possible to create a false  menstruation with medicines. Follow these instructions at home:  Maintain a healthy diet.  Manage weight problems.  Exercise regularly but not excessively.  Get adequate sleep.  Manage stress.  Be aware of changes in your menstrual cycle. Keep a record of when your periods occur. Note the date your period starts, how long it lasts, and any problems. Contact a health care provider if: Your symptoms do not get better with treatment. This information is not intended to replace advice given to you by your health care provider. Make sure you discuss any questions you have with your health care provider. Document Released: 11/09/2006 Document Revised: 03/05/2016 Document Reviewed: 03/16/2013 Elsevier Interactive Patient Education  2017 [employer]vier Inc[employer]  

## 2016-09-04 NOTE — MAU Provider Note (Signed)
[name] is a 25 y.o. G2P2002 at Unknown who presents to MAU today for pregnancy verification. The patient denies abdominal pain or vaginal bleeding today.   BP 132/65 (BP Location: Right Arm)   Pulse 64   Temp 98.1 F (36.7 C) (Oral)   Resp 18   LMP 08/04/2016   CONSTITUTIONAL: Well-developed, well-nourished female in no acute distress.  CARDIOVASCULAR: Regular heart rate RESPIRATORY: Normal effort NEUROLOGICAL: Alert and oriented to person, place, and time.  SKIN: Skin is warm and dry. No rash noted. Not diaphoretic. No erythema. No pallor. PSYCH: Normal mood and affect. Normal behavior. Normal judgment and thought content.  MDM Medical Screen Exam Complete  A: Amenorrhea  P: Discharge from MAU Patient advised to follow-up with [facility] for pregnancy confirmation  Monday-Thursday 8am-4pm or Friday 8am-11am Patient may return to MAU as needed or if her condition were to change or worsen  Patient is very upset that she will not be getting "a prenatal check up" today Asked her again if she was having any nausea,, pain or bleeding, and she states "no" Discussed we do not do routine prenatal care here in ED/MAU  Shelly Tate, CNM  09/04/2016 2:17 PM

## 2016-09-04 NOTE — MAU Note (Signed)
Pt had a pos HPT 2 days ago, missed her period.  Denies pain or bleeding.  Wants pregnancy verification.  Feels movement in stomach.

## 2016-09-05 ENCOUNTER — Encounter (HOSPITAL_COMMUNITY): Payer: Self-pay | Admitting: *Deleted

## 2016-09-05 ENCOUNTER — Encounter: Payer: Self-pay | Admitting: Obstetrics

## 2016-09-05 ENCOUNTER — Emergency Department (HOSPITAL_COMMUNITY): Payer: Medicaid Other

## 2016-09-05 ENCOUNTER — Emergency Department (HOSPITAL_COMMUNITY)
Admission: EM | Admit: 2016-09-05 | Discharge: 2016-09-05 | Disposition: A | Discharge status: Home / Self Care | Payer: Medicaid Other | Attending: Emergency Medicine | Admitting: Emergency Medicine

## 2016-09-05 DIAGNOSIS — Z3A01 Less than 8 weeks gestation of pregnancy: Secondary | ICD-10-CM | POA: Diagnosis not present

## 2016-09-05 DIAGNOSIS — N390 Urinary tract infection, site not specified: Secondary | ICD-10-CM

## 2016-09-05 DIAGNOSIS — R1030 Lower abdominal pain, unspecified: Secondary | ICD-10-CM

## 2016-09-05 DIAGNOSIS — Z3200 Encounter for pregnancy test, result unknown: Secondary | ICD-10-CM | POA: Diagnosis not present

## 2016-09-05 DIAGNOSIS — O99331 Smoking (tobacco) complicating pregnancy, first trimester: Secondary | ICD-10-CM | POA: Insufficient documentation

## 2016-09-05 DIAGNOSIS — O2341 Unspecified infection of urinary tract in pregnancy, first trimester: Secondary | ICD-10-CM | POA: Insufficient documentation

## 2016-09-05 DIAGNOSIS — F17200 Nicotine dependence, unspecified, uncomplicated: Secondary | ICD-10-CM | POA: Diagnosis not present

## 2016-09-05 DIAGNOSIS — Z79899 Other long term (current) drug therapy: Secondary | ICD-10-CM | POA: Diagnosis not present

## 2016-09-05 LAB — WET PREP, GENITAL
Trich, Wet Prep: NONE SEEN
Yeast Wet Prep HPF POC: NONE SEEN

## 2016-09-05 LAB — CBC
HEMATOCRIT: 40.3 % (ref 36.0–46.0)
HEMOGLOBIN: 13.6 g/dL (ref 12.0–15.0)
MCH: 27.3 pg (ref 26.0–34.0)
MCHC: 33.7 g/dL (ref 30.0–36.0)
MCV: 80.9 fL (ref 78.0–100.0)
Platelets: 322 10*3/uL (ref 150–400)
RBC: 4.98 MIL/uL (ref 3.87–5.11)
RDW: 13.4 % (ref 11.5–15.5)
WBC: 5.3 10*3/uL (ref 4.0–10.5)

## 2016-09-05 LAB — COMPREHENSIVE METABOLIC PANEL
ALBUMIN: 4.6 g/dL (ref 3.5–5.0)
ALK PHOS: 55 U/L (ref 38–126)
ALT: 14 U/L (ref 14–54)
ANION GAP: 8 (ref 5–15)
AST: 21 U/L (ref 15–41)
BUN: 8 mg/dL (ref 6–20)
CO2: 24 mmol/L (ref 22–32)
Calcium: 9.7 mg/dL (ref 8.9–10.3)
Chloride: 106 mmol/L (ref 101–111)
Creatinine, Ser: 0.89 mg/dL (ref 0.44–1.00)
GFR calc Af Amer: >60 mL/min (ref >60)
GFR calc non Af Amer: >60 mL/min (ref >60)
GLUCOSE: 82 mg/dL (ref 65–99)
POTASSIUM: 3.6 mmol/L (ref 3.5–5.1)
SODIUM: 138 mmol/L (ref 135–145)
Total Bilirubin: 0.5 mg/dL (ref 0.3–1.2)
Total Protein: 7.7 g/dL (ref 6.5–8.1)

## 2016-09-05 LAB — URINALYSIS, ROUTINE W REFLEX MICROSCOPIC
BILIRUBIN URINE: NEGATIVE
GLUCOSE, UA: NEGATIVE mg/dL
HGB URINE DIPSTICK: NEGATIVE
Ketones, ur: NEGATIVE mg/dL
Nitrite: NEGATIVE
PH: 6 (ref 5.0–8.0)
Protein, ur: NEGATIVE mg/dL
SPECIFIC GRAVITY, URINE: 1.028 (ref 1.005–1.030)

## 2016-09-05 LAB — URINE MICROSCOPIC-ADD ON

## 2016-09-05 LAB — LIPASE, BLOOD: Lipase: 41 U/L (ref 11–51)

## 2016-09-05 LAB — I-STAT BETA HCG BLOOD, ED (MC, WL, AP ONLY): I-stat hCG, quantitative: 1560.2 m[IU]/mL — ABNORMAL HIGH (ref <5)

## 2016-09-05 MED ORDER — CEPHALEXIN 500 MG PO CAPS: 500.0000 mg | ORAL_CAPSULE | Freq: Four times a day (QID) | ORAL | 0 refills | Status: DC

## 2016-09-05 MED ORDER — METRONIDAZOLE 500 MG PO TABS: 500.0000 mg | ORAL_TABLET | Freq: Two times a day (BID) | ORAL | 0 refills | Status: DC

## 2016-09-05 NOTE — ED Notes (Signed)
Patient is resting comfortably. 

## 2016-09-05 NOTE — ED Triage Notes (Signed)
Pt reports lower abd discomfort for several days. Had +preg test at home and reports lmp 10/24. Denies urinary or vaginal symptoms.

## 2016-09-05 NOTE — ED Provider Notes (Signed)
[facility]-EMERGENCY DEPT Provider Note   CSN: [id number] Arrival date & time: 09/05/16  1118     History   Chief Complaint Chief Complaint  Patient presents with  . Abdominal Pain    HPI Shelly Tate is a 25 y.o. female G3 P2 with no other significant past medical history presents to the ED today complaining of lower abdominal pain. Patient states that she has had intermittent lower abdominal cramping for one week. She reports associated "tingling" with urination but denies any pain. She does note feeling like her underwear is "more wet than normal" and is not sure if this is secondary to discharge. She is sexually active with one female partner, last encounter was one week ago. She states she took a pregnancy test at home and it was positive. She was last treated for chlamydia 5 weeks ago and states that her partner was also treated at that time. She denies any vaginal bleeding, nausea, vomiting, diarrhea, fevers.  HPI  Past Medical History:  Diagnosis Date  . Medical history non-contributory     Patient Active Problem List   Diagnosis Date Noted  . Normal labor 10/21/2013  . Normal delivery 10/21/2013    Past Surgical History:  Procedure Laterality Date  . NO PAST SURGERIES      OB History    Gravida Para Term Preterm AB Living   2 2 2     2    SAB TAB Ectopic Multiple Live Births           2       Home Medications    Prior to Admission medications   Not on File    Family History Family History  Problem Relation Age of Onset  . Alcohol abuse Neg Hx   . Arthritis Neg Hx   . Asthma Neg Hx   . Birth defects Neg Hx   . Cancer Neg Hx   . COPD Neg Hx   . Depression Neg Hx   . Diabetes Neg Hx   . Drug abuse Neg Hx   . Early death Neg Hx   . Hearing loss Neg Hx   . Heart disease Neg Hx   . Hyperlipidemia Neg Hx   . Hypertension Neg Hx   . Kidney disease Neg Hx   . Learning disabilities Neg Hx   . Mental illness Neg Hx   . Mental retardation Neg Hx   .  Miscarriages / Stillbirths Neg Hx   . Vision loss Neg Hx     Social History Social History  Substance Use Topics  . Smoking status: Current Some Day Smoker  . Smokeless tobacco: Never Used  . Alcohol use No     Allergies   Patient has no known allergies.   Review of Systems Review of Systems  All other systems reviewed and are negative.    Physical Exam Updated Vital Signs BP 132/72 (BP Location: Right Arm)   Pulse 78   Temp 97.7 F (36.5 C) (Oral)   Resp 20   LMP 08/04/2016   SpO2 100%   Physical Exam  Constitutional: She is oriented to person, place, and time. She appears well-developed and well-nourished. No distress.  HENT:  Head: Normocephalic and atraumatic.  Mouth/Throat: No oropharyngeal exudate.  Eyes: Conjunctivae and EOM are normal. Pupils are equal, round, and reactive to light. Right eye exhibits no discharge. Left eye exhibits no discharge. No scleral icterus.  Cardiovascular: Normal rate, regular rhythm, normal heart sounds and intact distal pulses.  Exam reveals no gallop and no friction rub.   No murmur heard. Pulmonary/Chest: Effort normal and breath sounds normal. No respiratory distress. She has no wheezes. She has no rales. She exhibits no tenderness.  Abdominal: Soft. She exhibits no distension. There is no tenderness. There is no guarding.  Genitourinary: Vaginal discharge found.  Genitourinary Comments: Moderate white vaginal discharge in vaginal vault. No CMT. No external vaginal lesions. No adnexal tenderness.  Musculoskeletal: Normal range of motion. She exhibits no edema.  Neurological: She is alert and oriented to person, place, and time.  Skin: Skin is warm and dry. No rash noted. She is not diaphoretic. No erythema. No pallor.  Psychiatric: She has a normal mood and affect. Her behavior is normal.  Nursing note and vitals reviewed.    ED Treatments / Results  Labs (all labs ordered are listed, but only abnormal results are  displayed) Labs Reviewed  URINALYSIS, ROUTINE W REFLEX MICROSCOPIC (NOT AT [facility]) - Abnormal; Notable for the following:       Result Value   APPearance CLOUDY (*)    Leukocytes, UA MODERATE (*)    All other components within normal limits  URINE MICROSCOPIC-ADD ON - Abnormal; Notable for the following:    Squamous Epithelial / LPF 6-30 (*)    Bacteria, UA RARE (*)    All other components within normal limits  I-STAT BETA HCG BLOOD, ED ([facility], [facility], [facility] ONLY) - Abnormal; Notable for the following:    I-stat hCG, quantitative 1,560.2 (*)    All other components within normal limits  WET PREP, GENITAL  LIPASE, BLOOD  COMPREHENSIVE METABOLIC PANEL  CBC  GC/CHLAMYDIA PROBE AMP ([place]) NOT AT [facility]    EKG  EKG Interpretation None       Radiology US Ob Comp Less 14 Wks  Result Date: 09/05/2016 CLINICAL DATA:  Pregnant patient with abdominal pain for multiple days. EXAM: OBSTETRIC <14 WK [place] AND TRANSVAGINAL OB US TECHNIQUE: Both transabdominal and transvaginal ultrasound examinations were performed for complete evaluation of the gestation as well as the maternal uterus, adnexal regions, and pelvic cul-de-sac. Transvaginal technique was performed to assess early pregnancy. COMPARISON:  None. FINDINGS: Intrauterine gestational sac: Single Yolk sac:  Not Visualized. Embryo:  Not Visualized. Cardiac Activity: Not Visualized. MSD: 10  mm   5 w   5  d Subchorionic hemorrhage:  None visualized. Maternal uterus/adnexae: Normal right and left ovaries. Trace free fluid in the pelvis. IMPRESSION: Probable early intrauterine gestational sac, but no yolk sac, fetal pole, or cardiac activity yet visualized. Recommend follow-up quantitative B-HCG levels and follow-up US in 14 days to assess viability. This recommendation follows SRU consensus guidelines: Diagnostic Criteria for Nonviable Pregnancy Early in the First Trimester. Malva Limes Med 2013; 161:0960-45. Electronically Signed   By: Annia Belt M.D.   On:  09/05/2016 16:57   US Ob Transvaginal  Result Date: 09/05/2016 CLINICAL DATA:  Pregnant patient with abdominal pain for multiple days. EXAM: OBSTETRIC <14 WK [place] AND TRANSVAGINAL OB US TECHNIQUE: Both transabdominal and transvaginal ultrasound examinations were performed for complete evaluation of the gestation as well as the maternal uterus, adnexal regions, and pelvic cul-de-sac. Transvaginal technique was performed to assess early pregnancy. COMPARISON:  None. FINDINGS: Intrauterine gestational sac: Single Yolk sac:  Not Visualized. Embryo:  Not Visualized. Cardiac Activity: Not Visualized. MSD: 10  mm   5 w   5  d Subchorionic hemorrhage:  None visualized. Maternal uterus/adnexae: Normal right and left ovaries. Trace free  fluid in the pelvis. IMPRESSION: Probable early intrauterine gestational sac, but no yolk sac, fetal pole, or cardiac activity yet visualized. Recommend follow-up quantitative B-HCG levels and follow-up US in 14 days to assess viability. This recommendation follows SRU consensus guidelines: Diagnostic Criteria for Nonviable Pregnancy Early in the First Trimester. Malva Limes Engl J Med 2013; 161:0960-45; 369:1443-51. Electronically Signed   By: Annia Beltrew  Davis M.D.   On: 09/05/2016 16:57    Procedures Procedures (including critical care time)  Medications Ordered in ED Medications - No data to display   Initial Impression / Assessment and Plan / ED Course  I have reviewed the triage vital signs and the nursing notes.  Pertinent labs & imaging results that were available during my care of the patient were reviewed by me and considered in my medical decision making (see chart for details).  Clinical Course     25 y.o F presents to the ED today c/o Intermittent lower abdominal pain times one week. She also has "tingling with urination" as well as increased vaginal discharge. On presentation the patient overall appears well. She is nontoxic and nonseptic appearing. Pregnancy test is positive. UA does  reveal moderate leukocytes, 6-30 WBC. Given the symptoms will treat his UTI. She also shows clue cells on wet prep. We'll treat with Flagyl. Patient declined GC and chlamydia prophylaxis at this time and would like to wait for results. Ultrasound also obtained due to concern for ectopic as pregnancy test was positive. This revealed probable early IUP but no yolk sac fetal pole or cardiac activity yet visualized. I have instructed patient to follow up in women's clinic in the next 2-3 days for repeat hCG Quant to ensure that this is not an ectopic pregnancy. Patient will be discharged with Keflex and Flagyl. Recommend follow-up with OB/GYN. Return precautions outlined in patient discharge instructions.  Case discussed with Dr. Ethelda ChickJacubowitz who agrees with treatment plan.  Final Clinical Impressions(s) / ED Diagnoses   Final diagnoses:  Lower abdominal pain  Urinary tract infection without hematuria, site unspecified  Less than [redacted] weeks gestation of pregnancy    New Prescriptions Discharge Medication List as of 09/05/2016  5:16 PM    START taking these medications   Details  cephALEXin (KEFLEX) 500 MG capsule Take 1 capsule (500 mg total) by mouth 4 (four) times daily., Starting Sat 09/05/2016, Print    metroNIDAZOLE (FLAGYL) 500 MG tablet Take 1 tablet (500 mg total) by mouth 2 (two) times daily., Starting Sat 09/05/2016, Print         Lester KinsmanSamantha Tripp [place], PA-C 09/07/16 1400    Doug SouSam Jacubowitz, MD 09/07/16 2340

## 2016-09-05 NOTE — ED Notes (Signed)
Pelvic exam done by Lelon MastSamantha - PA and Kenney Housemananya - EMT assisted.

## 2016-09-05 NOTE — Discharge Instructions (Signed)
Take antibiotics as prescribed. Take tylenol as needed for pain you need to follow up with the women's clinic in the next 2-3 days for repeat blood draw and quantitative analysis to ensure that this is not an ectopic pregnancy. You still have gonorrhea and chlamydia results that are pending. These will resolve in the next 2-3 days. If they are positive you'll be contacted and he will need to contact all sexual partners so they may be treated as well. [facility] if you experience severe worsening of her symptoms, vaginal bleeding, fevers.

## 2016-09-06 LAB — URINE CULTURE

## 2016-09-07 ENCOUNTER — Other Ambulatory Visit: Payer: Medicaid Other

## 2016-09-07 ENCOUNTER — Telehealth (HOSPITAL_BASED_OUTPATIENT_CLINIC_OR_DEPARTMENT_OTHER): Payer: Self-pay | Admitting: Emergency Medicine

## 2016-09-07 DIAGNOSIS — O3680X0 Pregnancy with inconclusive fetal viability, not applicable or unspecified: Secondary | ICD-10-CM

## 2016-09-07 NOTE — Telephone Encounter (Signed)
Post ED Visit - Positive Culture Follow-up  Culture report reviewed by antimicrobial stewardship pharmacist:  []  Enzo BiNathan Batchelder, Pharm.D. []  Celedonio MiyamotoJeremy Frens, Pharm.D., BCPS []  Garvin FilaMike Maccia, Pharm.D. []  Georgina PillionElizabeth Martin, Pharm.D., BCPS []  Co[place] HarborMinh Pham, [address]Pharm[address]D., BCPS, AAHIVP []  Estella HuskMichelle Turner, Pharm.D., BCPS, AAHIVP []  Tennis Mustassie Stewart, Pharm.D. []  Sherle Poeob Vincent, [address]Pharm[address] Apryl Dareen PianoAnderson PharmD  Positive urine culture Treated with metronidazole and cephalexin, organism sensitive to the same and no further patient follow-up is required at this time.  Berle MullMiller, Schylar Allard 09/07/2016, 10:15 AM

## 2016-09-08 LAB — HCG, QUANTITATIVE, PREGNANCY: hCG, Beta Chain, Quant, S: 3026.6 m[IU]/mL — ABNORMAL HIGH

## 2016-09-09 LAB — GC/CHLAMYDIA PROBE AMP (~~LOC~~) NOT AT ARMC
Chlamydia: POSITIVE — AB
Neisseria Gonorrhea: NEGATIVE

## 2016-09-30 ENCOUNTER — Encounter: Payer: Self-pay | Admitting: Obstetrics

## 2016-09-30 ENCOUNTER — Other Ambulatory Visit (HOSPITAL_COMMUNITY)
Admission: RE | Admit: 2016-09-30 | Discharge: 2016-09-30 | Disposition: A | Discharge status: Home / Self Care | Payer: Medicaid Other | Source: Ambulatory Visit | Attending: Obstetrics | Admitting: Obstetrics

## 2016-09-30 ENCOUNTER — Ambulatory Visit (INDEPENDENT_AMBULATORY_CARE_PROVIDER_SITE_OTHER): Payer: Medicaid Other | Admitting: Obstetrics

## 2016-09-30 ENCOUNTER — Ambulatory Visit: Payer: Medicaid Other | Admitting: Obstetrics

## 2016-09-30 VITALS — BP 113/60 | HR 79 | Temp 97.5°F | Wt 115.7 lb

## 2016-09-30 DIAGNOSIS — Z3481 Encounter for supervision of other normal pregnancy, first trimester: Secondary | ICD-10-CM

## 2016-09-30 DIAGNOSIS — Z3491 Encounter for supervision of normal pregnancy, unspecified, first trimester: Secondary | ICD-10-CM

## 2016-09-30 DIAGNOSIS — Z01419 Encounter for gynecological examination (general) (routine) without abnormal findings: Secondary | ICD-10-CM | POA: Insufficient documentation

## 2016-09-30 DIAGNOSIS — Z202 Contact with and (suspected) exposure to infections with a predominantly sexual mode of transmission: Secondary | ICD-10-CM

## 2016-09-30 DIAGNOSIS — O0990 Supervision of high risk pregnancy, unspecified, unspecified trimester: Secondary | ICD-10-CM | POA: Insufficient documentation

## 2016-09-30 DIAGNOSIS — Z3687 Encounter for antenatal screening for uncertain dates: Secondary | ICD-10-CM

## 2016-09-30 DIAGNOSIS — Z3490 Encounter for supervision of normal pregnancy, unspecified, unspecified trimester: Secondary | ICD-10-CM

## 2016-09-30 MED ORDER — CEFIXIME 400 MG PO TABS: 400.0000 mg | ORAL_TABLET | Freq: Once | ORAL | 0 refills | Status: AC

## 2016-09-30 MED ORDER — CITRANATAL BLOOM 90-1 MG PO TABS: 1.0000 | ORAL_TABLET | Freq: Every day | ORAL | 3 refills | Status: DC

## 2016-09-30 MED ORDER — AZITHROMYCIN 500 MG PO TABS: 1000.0000 mg | ORAL_TABLET | Freq: Every day | ORAL | 0 refills | Status: DC

## 2016-09-30 NOTE — Addendum Note (Signed)
Addended by: Francene FindersJAMES, Jeffrey Graefe C on: 09/30/2016 01:49 PM   Modules accepted: Orders

## 2016-09-30 NOTE — Addendum Note (Signed)
Addended by: Francene FindersJAMES, QUINETTA C on: 09/30/2016 11:19 AM   Modules accepted: Orders

## 2016-09-30 NOTE — Progress Notes (Signed)
Subjective:    Shelly Tate is being seen today for her first obstetrical visit.  This is not a planned pregnancy. She is at 7673w1d gestation. Her obstetrical history is significant for smoker and chlamydia infection. Relationship with FOB: significant other, not living together. Patient does intend to breast feed. Pregnancy history fully reviewed.  The information documented in the HPI was reviewed and verified.  Menstrual History: OB History    Gravida Para Term Preterm AB Living   3 2 2     2    SAB TAB Ectopic Multiple Live Births           2       Patient's last menstrual period was 08/04/2016 (approximate).    Past Medical History:  Diagnosis Date  . Medical history non-contributory     Past Surgical History:  Procedure Laterality Date  . NO PAST SURGERIES       (Not in a hospital admission) No Known Allergies  Social History  Substance Use Topics  . Smoking status: Current Some Day Smoker  . Smokeless tobacco: Never Used  . Alcohol use No    Family History  Problem Relation Age of Onset  . Alcohol abuse Neg Hx   . Arthritis Neg Hx   . Asthma Neg Hx   . Birth defects Neg Hx   . Cancer Neg Hx   . COPD Neg Hx   . Depression Neg Hx   . Diabetes Neg Hx   . Drug abuse Neg Hx   . Early death Neg Hx   . Hearing loss Neg Hx   . Heart disease Neg Hx   . Hyperlipidemia Neg Hx   . Hypertension Neg Hx   . Kidney disease Neg Hx   . Learning disabilities Neg Hx   . Mental illness Neg Hx   . Mental retardation Neg Hx   . Miscarriages / Stillbirths Neg Hx   . Vision loss Neg Hx      Review of Systems Constitutional: negative for weight loss Gastrointestinal: negative for vomiting Genitourinary:negative for genital lesions and vaginal discharge and dysuria Musculoskeletal:negative for back pain Behavioral/Psych: negative for abusive relationship, depression, illegal drug usage and tobacco use    Objective:    BP 113/60   Pulse 79   Temp 97.5 F (36.4 C)    Wt 115 lb 11.2 oz (52.5 kg)   LMP 08/04/2016 (Approximate)   BMI 21.16 kg/m  General Appearance:    Alert, cooperative, no distress, appears stated age  Head:    Normocephalic, without obvious abnormality, atraumatic  Eyes:    PERRL, conjunctiva/corneas clear, EOM's intact, fundi    benign, both eyes  Ears:    Normal TM's and external ear canals, both ears  Nose:   Nares normal, septum midline, mucosa normal, no drainage    or sinus tenderness  Throat:   Lips, mucosa, and tongue normal; teeth and gums normal  Neck:   Supple, symmetrical, trachea midline, no adenopathy;    thyroid:  no enlargement/tenderness/nodules; no carotid   bruit or JVD  Back:     Symmetric, no curvature, ROM normal, no CVA tenderness  Lungs:     Clear to auscultation bilaterally, respirations unlabored  Chest Wall:    No tenderness or deformity   Heart:    Regular rate and rhythm, S1 and S2 normal, no murmur, rub   or gallop  Breast Exam:    No tenderness, masses, or nipple abnormality  Abdomen:  Soft, non-tender, bowel sounds active all four quadrants,    no masses, no organomegaly  Genitalia:    Normal female without lesion, discharge or tenderness  Extremities:   Extremities normal, atraumatic, no cyanosis or edema  Pulses:   2+ and symmetric all extremities  Skin:   Skin color, texture, turgor normal, no rashes or lesions  Lymph nodes:   Cervical, supraclavicular, and axillary nodes normal  Neurologic:   CNII-XII intact, normal strength, sensation and reflexes    throughout      Lab Review Urine pregnancy test Labs reviewed yes Radiologic studies reviewed no Assessment:    Pregnancy at 8845w1d weeks    Chlamydia infection, untreated   Plan:     Azithromycin / Suprax Rx Prenatal vitamins.  Counseling provided regarding continued use of seat belts, cessation of alcohol consumption, smoking or use of illicit drugs; infection precautions i.e., influenza/TDAP immunizations, toxoplasmosis,CMV,  parvovirus, listeria and varicella; workplace safety, exercise during pregnancy; routine dental care, safe medications, sexual activity, hot tubs, saunas, pools, travel, caffeine use, fish and methlymercury, potential toxins, hair treatments, varicose veins Weight gain recommendations per IOM guidelines reviewed: underweight/BMI< 18.5--> gain 28 - 40 lbs; normal weight/BMI 18.5 - 24.9--> gain 25 - 35 lbs; overweight/BMI 25 - 29.9--> gain 15 - 25 lbs; obese/BMI >30->gain  11 - 20 lbs Problem list reviewed and updated. FIRST/CF mutation testing/NIPT/QUAD SCREEN/fragile X/Ashkenazi Jewish population testing/Spinal muscular atrophy discussed: requested. Role of ultrasound in pregnancy discussed; fetal survey: requested. Amniocentesis discussed: not indicated. VBAC calculator score: VBAC consent form provided Meds ordered this encounter  Medications  . azithromycin (ZITHROMAX) 500 MG tablet    Sig: Take 2 tablets (1,000 mg total) by mouth daily.    Dispense:  2 tablet    Refill:  0  . cefixime (SUPRAX) 400 MG tablet    Sig: Take 1 tablet (400 mg total) by mouth once.    Dispense:  1 tablet    Refill:  0  . Prenatal-DSS-FeCb-FeGl-FA (CITRANATAL BLOOM) 90-1 MG TABS    Sig: Take 1 tablet by mouth daily before breakfast.    Dispense:  90 tablet    Refill:  3   Orders Placed This Encounter  Procedures  . US OB Comp Less 14 Wks    Standing Status:   Future    Standing Expiration Date:   12/01/2017    Order Specific Question:   Reason for Exam (SYMPTOM  OR DIAGNOSIS REQUIRED)    Answer:   Unsure LMP    Order Specific Question:   [facility]    [facility]  . US OB Transvaginal    Standing Status:   Future    Standing Expiration Date:   12/01/2017    Order Specific Question:   Reason for Exam (SYMPTOM  OR DIAGNOSIS REQUIRED)    Answer:   Unsure LMP    Order Specific Question:   [facility]    [facility]    Follow up in 4  weeks. 50% of 20 min visit spent on counseling and coordination of care. Patient ID: Shelly Tate, female   DOB: 13-Dec-1990, 25 y.o.   MRN: [id number]

## 2016-10-01 LAB — CYTOLOGY - PAP: DIAGNOSIS: NEGATIVE

## 2016-10-02 LAB — HEMOGLOBINOPATHY EVALUATION
HGB A: 97.7 % (ref 96.4–98.8)
HGB C: 0 %
HGB S: 0 %
HGB VARIANT: 0 %
Hemoglobin A2 Quantitation: 2.3 % (ref 1.8–3.2)
Hemoglobin F Quantitation: 0 % (ref 0.0–2.0)

## 2016-10-02 LAB — OBSTETRIC PANEL, INCLUDING HIV
Antibody Screen: NEGATIVE
Basophils Absolute: 0 10*3/uL (ref 0.0–0.2)
Basos: 0 %
EOS (ABSOLUTE): 0.3 10*3/uL (ref 0.0–0.4)
Eos: 4 %
HIV Screen 4th Generation wRfx: NONREACTIVE
Hematocrit: 39.2 % (ref 34.0–46.6)
Hemoglobin: 12.4 g/dL (ref 11.1–15.9)
Hepatitis B Surface Ag: NEGATIVE
Immature Grans (Abs): 0 10*3/uL (ref 0.0–0.1)
Immature Granulocytes: 0 %
Lymphocytes Absolute: 1.8 10*3/uL (ref 0.7–3.1)
Lymphs: 23 %
MCH: 26.6 pg (ref 26.6–33.0)
MCHC: 31.6 g/dL (ref 31.5–35.7)
MCV: 84 fL (ref 79–97)
Monocytes Absolute: 0.6 10*3/uL (ref 0.1–0.9)
Monocytes: 8 %
Neutrophils Absolute: 5.1 10*3/uL (ref 1.4–7.0)
Neutrophils: 65 %
Platelets: 297 10*3/uL (ref 150–379)
RBC: 4.66 x10E6/uL (ref 3.77–5.28)
RDW: 15.6 % — ABNORMAL HIGH (ref 12.3–15.4)
RPR Ser Ql: NONREACTIVE
Rh Factor: POSITIVE
Rubella Antibodies, IGG: 2.32 index (ref >0.99)
WBC: 7.8 10*3/uL (ref 3.4–10.8)

## 2016-10-02 LAB — VITAMIN D 25 HYDROXY (VIT D DEFICIENCY, FRACTURES): Vit D, 25-Hydroxy: 25.3 ng/mL — ABNORMAL LOW (ref 30.0–100.0)

## 2016-10-02 LAB — VARICELLA ZOSTER ANTIBODY, IGG: Varicella zoster IgG: 1645 index (ref >165)

## 2016-10-04 LAB — CULTURE, OB URINE

## 2016-10-04 LAB — URINE CULTURE, OB REFLEX

## 2016-10-07 ENCOUNTER — Encounter: Payer: Self-pay | Admitting: Obstetrics

## 2016-10-07 ENCOUNTER — Other Ambulatory Visit: Payer: Self-pay | Admitting: Obstetrics

## 2016-10-07 MED ORDER — METRONIDAZOLE 500 MG PO TABS: 2000.0000 mg | ORAL_TABLET | Freq: Once | ORAL | 0 refills | Status: AC

## 2016-10-08 ENCOUNTER — Ambulatory Visit (HOSPITAL_COMMUNITY)
Admission: RE | Admit: 2016-10-08 | Discharge: 2016-10-08 | Disposition: A | Discharge status: Home / Self Care | Payer: Medicaid Other | Source: Ambulatory Visit | Attending: Obstetrics | Admitting: Obstetrics

## 2016-10-08 DIAGNOSIS — Z3A09 9 weeks gestation of pregnancy: Secondary | ICD-10-CM | POA: Insufficient documentation

## 2016-10-08 DIAGNOSIS — O283 Abnormal ultrasonic finding on antenatal screening of mother: Secondary | ICD-10-CM | POA: Insufficient documentation

## 2016-10-08 DIAGNOSIS — Z3490 Encounter for supervision of normal pregnancy, unspecified, unspecified trimester: Secondary | ICD-10-CM

## 2016-10-08 DIAGNOSIS — Z3687 Encounter for antenatal screening for uncertain dates: Secondary | ICD-10-CM

## 2016-10-08 LAB — TOXASSURE SELECT 13 (MW), URINE

## 2016-10-09 ENCOUNTER — Other Ambulatory Visit: Payer: Self-pay | Admitting: Obstetrics

## 2016-10-09 DIAGNOSIS — B373 Candidiasis of vulva and vagina: Secondary | ICD-10-CM

## 2016-10-09 DIAGNOSIS — Z202 Contact with and (suspected) exposure to infections with a predominantly sexual mode of transmission: Secondary | ICD-10-CM

## 2016-10-09 DIAGNOSIS — B3731 Acute candidiasis of vulva and vagina: Secondary | ICD-10-CM

## 2016-10-09 DIAGNOSIS — A749 Chlamydial infection, unspecified: Secondary | ICD-10-CM

## 2016-10-09 DIAGNOSIS — B9689 Other specified bacterial agents as the cause of diseases classified elsewhere: Secondary | ICD-10-CM

## 2016-10-09 DIAGNOSIS — N760 Acute vaginitis: Secondary | ICD-10-CM

## 2016-10-09 DIAGNOSIS — O98819 Other maternal infectious and parasitic diseases complicating pregnancy, unspecified trimester: Principal | ICD-10-CM

## 2016-10-09 DIAGNOSIS — A5901 Trichomonal vulvovaginitis: Secondary | ICD-10-CM

## 2016-10-09 LAB — NUSWAB VG+, CANDIDA 6SP
Atopobium vaginae: HIGH Score — AB
BVAB 2: HIGH Score — AB
CANDIDA ALBICANS, NAA: POSITIVE — AB
CANDIDA TROPICALIS, NAA: NEGATIVE
Candida glabrata, NAA: NEGATIVE
Candida krusei, NAA: NEGATIVE
Candida lusitaniae, NAA: NEGATIVE
Candida parapsilosis, NAA: NEGATIVE
Chlamydia trachomatis, NAA: POSITIVE — AB
MEGASPHAERA 1: HIGH {score} — AB
Neisseria gonorrhoeae, NAA: NEGATIVE
TRICH VAG BY NAA: POSITIVE — AB

## 2016-10-09 MED ORDER — TINIDAZOLE 500 MG PO TABS: 2.0000 g | ORAL_TABLET | Freq: Every day | ORAL | 0 refills | Status: AC

## 2016-10-09 MED ORDER — AZITHROMYCIN 500 MG PO TABS: 1000.0000 mg | ORAL_TABLET | Freq: Once | ORAL | 0 refills | Status: AC

## 2016-10-09 MED ORDER — TERCONAZOLE 0.8 % VA CREA: 1.0000 | TOPICAL_CREAM | Freq: Every day | VAGINAL | 0 refills | Status: DC

## 2016-10-09 MED ORDER — CEFIXIME 400 MG PO TABS: 400.0000 mg | ORAL_TABLET | Freq: Once | ORAL | 0 refills | Status: AC

## 2016-10-12 NOTE — L&D Delivery Note (Signed)
26 y.o. [id number] at 3017w0d admitted for PROM @ 0700 on 7/10 with A1GDM delivered a viable female infant at [id number] in cephalic, OA position. No nuchal cord. Left anterior shoulder delivered with ease. 60 sec delayed cord clamping. Cord clamped x2 and cut. Placenta delivered spontaneously intact, with 3VC. Fundus firm on exam with massage and pitocin. Good hemostasis noted.  Anesthesia: Epidural Laceration: No Good hemostasis noted. EBL: 50 cc  Mom and baby recovering in LDR.    Apgars: APGAR (1 MIN): 8   APGAR (5 MINS): 9   Weight: 6lb 4.9oz  Sponge and instrument count were correct x2. Placenta sent to L&D. Girl, Breast and bottle feeding, Nexplanon or Depo for contraception.  [place] Zarin Knupp, DO FM Resident PGY-1 04/20/2017 5:46 PM

## 2016-10-14 NOTE — Progress Notes (Signed)
Patient not seen.  Appointment cancelled

## 2016-10-28 ENCOUNTER — Encounter: Payer: Medicaid Other | Admitting: Obstetrics and Gynecology

## 2016-11-11 ENCOUNTER — Ambulatory Visit (INDEPENDENT_AMBULATORY_CARE_PROVIDER_SITE_OTHER): Payer: Medicaid Other | Admitting: Obstetrics

## 2016-11-11 ENCOUNTER — Encounter: Payer: Self-pay | Admitting: Obstetrics

## 2016-11-11 VITALS — BP 113/68 | HR 86 | Wt 119.2 lb

## 2016-11-11 DIAGNOSIS — Z3480 Encounter for supervision of other normal pregnancy, unspecified trimester: Secondary | ICD-10-CM

## 2016-11-11 DIAGNOSIS — Z3482 Encounter for supervision of other normal pregnancy, second trimester: Secondary | ICD-10-CM

## 2016-11-11 NOTE — Progress Notes (Signed)
Subjective:  Shelly Tate is a 26 y.o. G3P2002 at 123w1d being seen today for ongoing prenatal care.  She is currently monitored for the following issues for this low-risk pregnancy and has Normal labor; Normal delivery; and Supervision of normal pregnancy, antepartum on her problem list.  Patient reports nausea and vomiting.  Contractions: Not present. Vag. Bleeding: None.   . Denies leaking of fluid.   The following portions of the patient's history were reviewed and updated as appropriate: allergies, current medications, past family history, past medical history, past social history, past surgical history and problem list. Problem list updated.  Objective:   Vitals:   11/11/16 1022  BP: 113/68  Pulse: 86  Weight: 119 lb 3.2 oz (54.1 kg)    Fetal Status: Fetal Heart Rate (bpm): 150         General:  Alert, oriented and cooperative. Patient is in no acute distress.  Skin: Skin is warm and dry. No rash noted.   Cardiovascular: Normal heart rate noted  Respiratory: Normal respiratory effort, no problems with respiration noted  Abdomen: Soft, gravid, appropriate for gestational age. Pain/Pressure: Absent     Pelvic:  Cervical exam deferred        Extremities: Normal range of motion.  Edema: None  Mental Status: Normal mood and affect. Normal behavior. Normal judgment and thought content.   Urinalysis:      Assessment and Plan:  Pregnancy: G3P2002 at 2823w1d  There are no diagnoses linked to this encounter. Preterm labor symptoms and general obstetric precautions including but not limited to vaginal bleeding, contractions, leaking of fluid and fetal movement were reviewed in detail with the patient. Please refer to After Visit Summary for other counseling recommendations.  No Follow-up on file.   Brock Badharles A Amour Trigg, MDPatient ID: Shelly Tate, female   DOB: 06/20/1991, 26 y.o.   MRN: [id number]

## 2016-11-11 NOTE — Progress Notes (Signed)
Patient reports vomiting 3-4 times/daily

## 2016-11-25 ENCOUNTER — Encounter: Payer: Medicaid Other | Admitting: Certified Nurse Midwife

## 2016-12-04 ENCOUNTER — Encounter: Payer: Self-pay | Admitting: Certified Nurse Midwife

## 2016-12-04 ENCOUNTER — Ambulatory Visit (INDEPENDENT_AMBULATORY_CARE_PROVIDER_SITE_OTHER): Payer: Medicaid Other | Admitting: Certified Nurse Midwife

## 2016-12-04 VITALS — BP 126/77 | HR 88 | Wt 124.0 lb

## 2016-12-04 DIAGNOSIS — Z3480 Encounter for supervision of other normal pregnancy, unspecified trimester: Secondary | ICD-10-CM

## 2016-12-04 DIAGNOSIS — Z3482 Encounter for supervision of other normal pregnancy, second trimester: Secondary | ICD-10-CM

## 2016-12-04 NOTE — Progress Notes (Signed)
   PRENATAL VISIT NOTE  Subjective:  Shelly Tate is a 26 y.o. G3P2002 at 6761w3d being seen today for ongoing prenatal care.  She is currently monitored for the following issues for this low-risk pregnancy and has Supervision of normal pregnancy, antepartum on her problem list.  Patient reports no complaints.  Contractions: Not present. Vag. Bleeding: None.  Movement: Present. Denies leaking of fluid.   The following portions of the patient's history were reviewed and updated as appropriate: allergies, current medications, past family history, past medical history, past social history, past surgical history and problem list. Problem list updated.  Objective:   Vitals:   12/04/16 0925  BP: 126/77  Pulse: 88  Weight: 124 lb (56.2 kg)    Fetal Status: Fetal Heart Rate (bpm): 150 Fundal Height: 20 cm Movement: Present     General:  Alert, oriented and cooperative. Patient is in no acute distress.  Skin: Skin is warm and dry. No rash noted.   Cardiovascular: Normal heart rate noted  Respiratory: Normal respiratory effort, no problems with respiration noted  Abdomen: Soft, gravid, appropriate for gestational age. Pain/Pressure: Absent     Pelvic:  Cervical exam deferred        Extremities: Normal range of motion.     Mental Status: Normal mood and affect. Normal behavior. Normal judgment and thought content.   Assessment and Plan:  Pregnancy: G3P2002 at 361w3d  1. Supervision of other normal pregnancy, antepartum      Doing well.  - AFP, Serum, Open Spina Bifida - MaterniT21 PLUS Core+SCA - Hemoglobin A1c - US MFM OB COMP + 14 WK; Future  Preterm labor symptoms and general obstetric precautions including but not limited to vaginal bleeding, contractions, leaking of fluid and fetal movement were reviewed in detail with the patient. Please refer to After Visit Summary for other counseling recommendations.  Return in about 4 weeks (around 01/01/2017).   Roe Coombsachelle A Doralene Glanz,  CNM

## 2016-12-05 LAB — HEMOGLOBIN A1C
ESTIMATED AVERAGE GLUCOSE: 94 mg/dL
HEMOGLOBIN A1C: 4.9 % (ref 4.8–5.6)

## 2016-12-12 LAB — AFP, SERUM, OPEN SPINA BIFIDA
AFP MoM: 1.65
AFP VALUE AFPOSL: 81.3 ng/mL
GEST. AGE ON COLLECTION DATE: 17.4 wk
Maternal Age At EDD: 24.2 years
OSBR RISK 1 IN: 3696
Test Results:: NEGATIVE
Weight: 124 [lb_av]

## 2016-12-13 LAB — MATERNIT21 PLUS CORE+SCA
Chromosome 13: NEGATIVE
Chromosome 18: NEGATIVE
Chromosome 21: NEGATIVE
Y CHROMOSOME: NOT DETECTED

## 2016-12-14 ENCOUNTER — Other Ambulatory Visit: Payer: Self-pay | Admitting: Certified Nurse Midwife

## 2016-12-14 DIAGNOSIS — Z3480 Encounter for supervision of other normal pregnancy, unspecified trimester: Secondary | ICD-10-CM

## 2016-12-16 ENCOUNTER — Ambulatory Visit (HOSPITAL_COMMUNITY)
Admission: RE | Admit: 2016-12-16 | Discharge: 2016-12-16 | Disposition: A | Discharge status: Home / Self Care | Payer: Medicaid Other | Source: Ambulatory Visit | Attending: Certified Nurse Midwife | Admitting: Certified Nurse Midwife

## 2016-12-16 DIAGNOSIS — Z3A19 19 weeks gestation of pregnancy: Secondary | ICD-10-CM | POA: Insufficient documentation

## 2016-12-16 DIAGNOSIS — Z3480 Encounter for supervision of other normal pregnancy, unspecified trimester: Secondary | ICD-10-CM

## 2016-12-16 DIAGNOSIS — Z363 Encounter for antenatal screening for malformations: Secondary | ICD-10-CM | POA: Diagnosis present

## 2016-12-17 ENCOUNTER — Other Ambulatory Visit: Payer: Self-pay | Admitting: Certified Nurse Midwife

## 2016-12-17 DIAGNOSIS — Z3480 Encounter for supervision of other normal pregnancy, unspecified trimester: Secondary | ICD-10-CM

## 2017-01-01 ENCOUNTER — Ambulatory Visit (INDEPENDENT_AMBULATORY_CARE_PROVIDER_SITE_OTHER): Payer: Medicaid Other | Admitting: Certified Nurse Midwife

## 2017-01-01 ENCOUNTER — Encounter: Payer: Self-pay | Admitting: Certified Nurse Midwife

## 2017-01-01 ENCOUNTER — Other Ambulatory Visit (HOSPITAL_COMMUNITY)
Admission: RE | Admit: 2017-01-01 | Discharge: 2017-01-01 | Disposition: A | Discharge status: Home / Self Care | Payer: Medicaid Other | Source: Ambulatory Visit | Attending: Certified Nurse Midwife | Admitting: Certified Nurse Midwife

## 2017-01-01 VITALS — BP 125/66 | HR 97 | Wt 131.0 lb

## 2017-01-01 DIAGNOSIS — Z3480 Encounter for supervision of other normal pregnancy, unspecified trimester: Secondary | ICD-10-CM

## 2017-01-01 DIAGNOSIS — A599 Trichomoniasis, unspecified: Secondary | ICD-10-CM | POA: Insufficient documentation

## 2017-01-01 DIAGNOSIS — O98312 Other infections with a predominantly sexual mode of transmission complicating pregnancy, second trimester: Secondary | ICD-10-CM

## 2017-01-01 DIAGNOSIS — A749 Chlamydial infection, unspecified: Secondary | ICD-10-CM | POA: Insufficient documentation

## 2017-01-01 DIAGNOSIS — O98812 Other maternal infectious and parasitic diseases complicating pregnancy, second trimester: Secondary | ICD-10-CM

## 2017-01-01 DIAGNOSIS — Z3492 Encounter for supervision of normal pregnancy, unspecified, second trimester: Secondary | ICD-10-CM | POA: Diagnosis present

## 2017-01-01 DIAGNOSIS — Z3A21 21 weeks gestation of pregnancy: Secondary | ICD-10-CM | POA: Diagnosis not present

## 2017-01-01 DIAGNOSIS — Z3482 Encounter for supervision of other normal pregnancy, second trimester: Secondary | ICD-10-CM

## 2017-01-01 DIAGNOSIS — O98811 Other maternal infectious and parasitic diseases complicating pregnancy, first trimester: Secondary | ICD-10-CM

## 2017-01-01 NOTE — Progress Notes (Signed)
   PRENATAL VISIT NOTE  Subjective:  Shelly Tate is a 26 y.o. G3P2002 at 1415w3d being seen today for ongoing prenatal care.  She is currently monitored for the following issues for this low-risk pregnancy and has Supervision of normal pregnancy, antepartum; Chlamydia infection affecting pregnancy; and Trichomoniasis on her problem list.  Patient reports no complaints.  Contractions: Not present. Vag. Bleeding: None.  Movement: Present. Denies leaking of fluid.   The following portions of the patient's history were reviewed and updated as appropriate: allergies, current medications, past family history, past medical history, past social history, past surgical history and problem list. Problem list updated.  Objective:   Vitals:   01/01/17 1022  BP: 125/66  Pulse: 97  Weight: 131 lb (59.4 kg)    Fetal Status: Fetal Heart Rate (bpm): 154 Fundal Height: 22 cm Movement: Present     General:  Alert, oriented and cooperative. Patient is in no acute distress.  Skin: Skin is warm and dry. No rash noted.   Cardiovascular: Normal heart rate noted  Respiratory: Normal respiratory effort, no problems with respiration noted  Abdomen: Soft, gravid, appropriate for gestational age. Pain/Pressure: Absent     Pelvic:  Cervical exam deferred        Extremities: Normal range of motion.  Edema: None  Mental Status: Normal mood and affect. Normal behavior. Normal judgment and thought content.   Assessment and Plan:  Pregnancy: G3P2002 at 9515w3d  1. Supervision of other normal pregnancy, antepartum     Doing well  2. Chlamydia infection affecting pregnancy in first trimester     TOC today: urine - Cervicovaginal ancillary only  3. Trichomoniasis     TOC today: urine - Cervicovaginal ancillary only  Preterm labor symptoms and general obstetric precautions including but not limited to vaginal bleeding, contractions, leaking of fluid and fetal movement were reviewed in detail with the  patient. Please refer to After Visit Summary for other counseling recommendations.  Return in about 4 weeks (around 01/29/2017) for ROB.   Roe Coombsachelle A Aissatou Fronczak, CNM

## 2017-01-05 LAB — CERVICOVAGINAL ANCILLARY ONLY
Chlamydia: POSITIVE — AB
Neisseria Gonorrhea: NEGATIVE
Trichomonas: POSITIVE — AB

## 2017-01-07 ENCOUNTER — Other Ambulatory Visit: Payer: Self-pay | Admitting: Certified Nurse Midwife

## 2017-01-07 ENCOUNTER — Encounter: Payer: Self-pay | Admitting: *Deleted

## 2017-01-07 ENCOUNTER — Telehealth: Payer: Self-pay | Admitting: *Deleted

## 2017-01-07 DIAGNOSIS — A749 Chlamydial infection, unspecified: Secondary | ICD-10-CM

## 2017-01-07 DIAGNOSIS — O98812 Other maternal infectious and parasitic diseases complicating pregnancy, second trimester: Secondary | ICD-10-CM

## 2017-01-07 DIAGNOSIS — A599 Trichomoniasis, unspecified: Secondary | ICD-10-CM

## 2017-01-07 MED ORDER — AZITHROMYCIN 250 MG PO TABS: ORAL_TABLET | ORAL | 0 refills | Status: DC

## 2017-01-07 MED ORDER — METRONIDAZOLE 500 MG PO TABS: 2000.0000 mg | ORAL_TABLET | Freq: Once | ORAL | 0 refills | Status: AC

## 2017-01-07 NOTE — Telephone Encounter (Signed)
Attempted to call patient for 2 days without success- letter requesting patient to call office regarding lab results mailed.

## 2017-01-11 ENCOUNTER — Telehealth: Payer: Self-pay | Admitting: *Deleted

## 2017-01-11 NOTE — Telephone Encounter (Signed)
Attempt to contact pt regarding lab results.  LM on VM to call office.

## 2017-01-29 ENCOUNTER — Encounter: Payer: Medicaid Other | Admitting: Certified Nurse Midwife

## 2017-02-15 ENCOUNTER — Encounter: Payer: Medicaid Other | Admitting: Certified Nurse Midwife

## 2017-02-23 ENCOUNTER — Ambulatory Visit (INDEPENDENT_AMBULATORY_CARE_PROVIDER_SITE_OTHER): Payer: Medicaid Other | Admitting: Certified Nurse Midwife

## 2017-02-23 ENCOUNTER — Other Ambulatory Visit (HOSPITAL_COMMUNITY)
Admission: RE | Admit: 2017-02-23 | Discharge: 2017-02-23 | Disposition: A | Discharge status: Home / Self Care | Payer: Medicaid Other | Source: Ambulatory Visit | Attending: Certified Nurse Midwife | Admitting: Certified Nurse Midwife

## 2017-02-23 VITALS — BP 104/63 | HR 108 | Wt 146.1 lb

## 2017-02-23 DIAGNOSIS — O98813 Other maternal infectious and parasitic diseases complicating pregnancy, third trimester: Secondary | ICD-10-CM

## 2017-02-23 DIAGNOSIS — Z3480 Encounter for supervision of other normal pregnancy, unspecified trimester: Secondary | ICD-10-CM

## 2017-02-23 DIAGNOSIS — O98812 Other maternal infectious and parasitic diseases complicating pregnancy, second trimester: Secondary | ICD-10-CM | POA: Insufficient documentation

## 2017-02-23 DIAGNOSIS — Z3483 Encounter for supervision of other normal pregnancy, third trimester: Secondary | ICD-10-CM

## 2017-02-23 DIAGNOSIS — A599 Trichomoniasis, unspecified: Secondary | ICD-10-CM

## 2017-02-23 DIAGNOSIS — A749 Chlamydial infection, unspecified: Secondary | ICD-10-CM | POA: Insufficient documentation

## 2017-02-23 NOTE — Progress Notes (Signed)
Patient reports good fetal movement and occasional headache, denies contractions. Pt states that she took prescribed antibiotics at the beginning of April.

## 2017-02-24 LAB — CERVICOVAGINAL ANCILLARY ONLY
CHLAMYDIA, DNA PROBE: POSITIVE — AB
NEISSERIA GONORRHEA: NEGATIVE
Trichomonas: POSITIVE — AB

## 2017-02-24 NOTE — Progress Notes (Signed)
   PRENATAL VISIT NOTE  Subjective:  Shelly Tate is a 26 y.o. G3P2002 at 8581w1d being seen today for ongoing prenatal care.  She is currently monitored for the following issues for this low-risk pregnancy and has Supervision of normal pregnancy, antepartum; Chlamydia infection affecting pregnancy in second trimester; and Trichimoniasis on her problem list.  Patient reports no complaints.  Contractions: Not present. Vag. Bleeding: None.  Movement: Present. Denies leaking of fluid.   The following portions of the patient's history were reviewed and updated as appropriate: allergies, current medications, past family history, past medical history, past social history, past surgical history and problem list. Problem list updated.  Objective:   Vitals:   02/23/17 1446  BP: 104/63  Pulse: (!) 108  Weight: 146 lb 1.6 oz (66.3 kg)    Fetal Status: Fetal Heart Rate (bpm): 148 Fundal Height: 30 cm Movement: Present     General:  Alert, oriented and cooperative. Patient is in no acute distress.  Skin: Skin is warm and dry. No rash noted.   Cardiovascular: Normal heart rate noted  Respiratory: Normal respiratory effort, no problems with respiration noted  Abdomen: Soft, gravid, appropriate for gestational age. Pain/Pressure: Absent     Pelvic:  Cervical exam deferred        Extremities: Normal range of motion.  Edema: Trace  Mental Status: Normal mood and affect. Normal behavior. Normal judgment and thought content.   Assessment and Plan:  Pregnancy: G3P2002 at 3181w1d  1. Supervision of other normal pregnancy, antepartum     Doing well  2. Chlamydia infection affecting pregnancy in second trimester      TOC today - Cervicovaginal ancillary only  3. Trichimoniasis      TOC today - Cervicovaginal ancillary only  Preterm labor symptoms and general obstetric precautions including but not limited to vaginal bleeding, contractions, leaking of fluid and fetal movement were reviewed in  detail with the patient. Please refer to After Visit Summary for other counseling recommendations.  Return in about 2 weeks (around 03/09/2017) for ROB, needs 2 hour OGTT ASAP.   Roe Coombsenney, Itali Mckendry A, CNM

## 2017-02-25 ENCOUNTER — Other Ambulatory Visit: Payer: Self-pay | Admitting: Certified Nurse Midwife

## 2017-02-25 DIAGNOSIS — O98812 Other maternal infectious and parasitic diseases complicating pregnancy, second trimester: Principal | ICD-10-CM

## 2017-02-25 DIAGNOSIS — A599 Trichomoniasis, unspecified: Secondary | ICD-10-CM

## 2017-02-25 DIAGNOSIS — A749 Chlamydial infection, unspecified: Secondary | ICD-10-CM

## 2017-02-25 MED ORDER — AMOXICILLIN 500 MG PO CAPS: 500.0000 mg | ORAL_CAPSULE | Freq: Three times a day (TID) | ORAL | 0 refills | Status: DC

## 2017-02-25 MED ORDER — TINIDAZOLE 500 MG PO TABS: 2.0000 g | ORAL_TABLET | Freq: Every day | ORAL | 0 refills | Status: DC

## 2017-02-26 ENCOUNTER — Other Ambulatory Visit: Payer: Medicaid Other

## 2017-02-26 DIAGNOSIS — Z3483 Encounter for supervision of other normal pregnancy, third trimester: Secondary | ICD-10-CM

## 2017-02-27 LAB — CBC
HEMATOCRIT: 34.3 % (ref 34.0–46.6)
HEMOGLOBIN: 11.6 g/dL (ref 11.1–15.9)
MCH: 26.9 pg (ref 26.6–33.0)
MCHC: 33.8 g/dL (ref 31.5–35.7)
MCV: 79 fL (ref 79–97)
Platelets: 225 10*3/uL (ref 150–379)
RBC: 4.32 x10E6/uL (ref 3.77–5.28)
RDW: 15.4 % (ref 12.3–15.4)
WBC: 7 10*3/uL (ref 3.4–10.8)

## 2017-02-27 LAB — RPR: RPR: NONREACTIVE

## 2017-02-27 LAB — HIV ANTIBODY (ROUTINE TESTING W REFLEX): HIV Screen 4th Generation wRfx: NONREACTIVE

## 2017-02-27 LAB — GLUCOSE TOLERANCE, 2 HOURS W/ 1HR
GLUCOSE, FASTING: 107 mg/dL — AB (ref 65–91)
Glucose, 1 hour: 113 mg/dL (ref 65–179)
Glucose, 2 hour: 91 mg/dL (ref 65–152)

## 2017-03-01 ENCOUNTER — Telehealth: Payer: Self-pay

## 2017-03-01 ENCOUNTER — Other Ambulatory Visit: Payer: Self-pay | Admitting: Certified Nurse Midwife

## 2017-03-01 DIAGNOSIS — O0990 Supervision of high risk pregnancy, unspecified, unspecified trimester: Secondary | ICD-10-CM

## 2017-03-01 DIAGNOSIS — O24419 Gestational diabetes mellitus in pregnancy, unspecified control: Secondary | ICD-10-CM | POA: Insufficient documentation

## 2017-03-01 NOTE — Telephone Encounter (Signed)
-----   Message from Roe Coombsachelle A Denney, CNM sent at 03/01/2017  9:26 AM EDT ----- Please let her know that she did not pass her glucose testing.  Please order her glucometer, test strips and lancets.  MFM referral orders have been placed.  Thank you.  R.Denney CNM

## 2017-03-01 NOTE — Telephone Encounter (Signed)
LM on VM for pt to return call to the office

## 2017-03-10 ENCOUNTER — Encounter: Payer: Self-pay | Admitting: Certified Nurse Midwife

## 2017-03-10 ENCOUNTER — Ambulatory Visit (INDEPENDENT_AMBULATORY_CARE_PROVIDER_SITE_OTHER): Payer: Medicaid Other | Admitting: Certified Nurse Midwife

## 2017-03-10 VITALS — BP 107/70 | HR 102 | Wt 157.0 lb

## 2017-03-10 DIAGNOSIS — A749 Chlamydial infection, unspecified: Secondary | ICD-10-CM

## 2017-03-10 DIAGNOSIS — A599 Trichomoniasis, unspecified: Secondary | ICD-10-CM

## 2017-03-10 DIAGNOSIS — O98813 Other maternal infectious and parasitic diseases complicating pregnancy, third trimester: Secondary | ICD-10-CM

## 2017-03-10 DIAGNOSIS — O98812 Other maternal infectious and parasitic diseases complicating pregnancy, second trimester: Secondary | ICD-10-CM

## 2017-03-10 DIAGNOSIS — O24419 Gestational diabetes mellitus in pregnancy, unspecified control: Secondary | ICD-10-CM

## 2017-03-10 DIAGNOSIS — O0990 Supervision of high risk pregnancy, unspecified, unspecified trimester: Secondary | ICD-10-CM

## 2017-03-10 DIAGNOSIS — O0993 Supervision of high risk pregnancy, unspecified, third trimester: Secondary | ICD-10-CM

## 2017-03-10 MED ORDER — GLUCOSE BLOOD VI STRP: ORAL_STRIP | 5 refills | Status: DC

## 2017-03-10 MED ORDER — ACCU-CHEK GUIDE W/DEVICE KIT: 1.0000 | PACK | Freq: Once | 0 refills | Status: AC

## 2017-03-10 MED ORDER — ACCU-CHEK MULTICLIX LANCETS MISC: 5 refills | Status: DC

## 2017-03-10 NOTE — Progress Notes (Signed)
   PRENATAL VISIT NOTE  Subjective:  Shelly Tate is a 26 y.o. G3P2002 at 33w1dbeing seen today for ongoing prenatal care.  She is currently monitored for the following issues for this high-risk pregnancy and has Supervision of high risk pregnancy, antepartum; Chlamydia infection affecting pregnancy in second trimester; Trichimoniasis; and GDM (gestational diabetes mellitus) on her problem list.  Patient reports no complaints.  Contractions: Not present. Vag. Bleeding: None.  Movement: Present. Denies leaking of fluid.   The following portions of the patient's history were reviewed and updated as appropriate: allergies, current medications, past family history, past medical history, past social history, past surgical history and problem list. Problem list updated.  Objective:   Vitals:   03/10/17 1337  BP: 107/70  Pulse: (!) 102  Weight: 157 lb (71.2 kg)    Fetal Status: Fetal Heart Rate (bpm): 146 Fundal Height: 31 cm Movement: Present     General:  Alert, oriented and cooperative. Patient is in no acute distress.  Skin: Skin is warm and dry. No rash noted.   Cardiovascular: Normal heart rate noted  Respiratory: Normal respiratory effort, no problems with respiration noted  Abdomen: Soft, gravid, appropriate for gestational age. Pain/Pressure: Absent     Pelvic:  Cervical exam deferred        Extremities: Normal range of motion.     Mental Status: Normal mood and affect. Normal behavior. Normal judgment and thought content.   Assessment and Plan:  Pregnancy: G3P2002 at 353w1d1. Supervision of high risk pregnancy, antepartum        2. Gestational diabetes mellitus (GDM) in third trimester, gestational diabetes method of control unspecified     Has DM class and USKoreacheduled.  - Blood Glucose Monitoring Suppl (ACCU-CHEK GUIDE) w/Device KIT; 1 kit by Does not apply route once.  Dispense: 1 kit; Refill: 0 - glucose blood (ACCU-CHEK GUIDE) test strip; Use 1 strip to test  blood glucose 4 times daily  Dispense: 100 each; Refill: 5 - Lancets (ACCU-CHEK MULTICLIX) lancets; Use 1 lancet to test blood glucose 4 times daily.  Dispense: 100 each; Refill: 5  3. Chlamydia infection affecting pregnancy in second trimester      Will pick up medications today  4. Trichimoniasis      Will pick up medications today  Preterm labor symptoms and general obstetric precautions including but not limited to vaginal bleeding, contractions, leaking of fluid and fetal movement were reviewed in detail with the patient. Please refer to After Visit Summary for other counseling recommendations.  [facility] about 2 [facility] (around 03/24/2017[facility]  RaMorene CrockerCNM

## 2017-03-10 NOTE — Progress Notes (Signed)
Pt has been made aware of all lab results. Pt made aware of importance to get Rx and have partner treated. Pt made aware that diabetic supplies will be sent to pharmacy today.  Pt states she has increase in LE swelling.

## 2017-03-18 ENCOUNTER — Encounter (HOSPITAL_COMMUNITY): Payer: Self-pay

## 2017-03-18 ENCOUNTER — Ambulatory Visit (HOSPITAL_COMMUNITY)
Admission: RE | Admit: 2017-03-18 | Discharge: 2017-03-18 | Disposition: A | Discharge status: Home / Self Care | Payer: Medicaid Other | Source: Ambulatory Visit | Attending: Certified Nurse Midwife | Admitting: Certified Nurse Midwife

## 2017-03-18 ENCOUNTER — Other Ambulatory Visit (HOSPITAL_COMMUNITY): Payer: Self-pay | Admitting: *Deleted

## 2017-03-18 ENCOUNTER — Other Ambulatory Visit: Payer: Self-pay | Admitting: Certified Nurse Midwife

## 2017-03-18 ENCOUNTER — Encounter: Payer: Medicaid Other | Attending: Obstetrics | Admitting: *Deleted

## 2017-03-18 DIAGNOSIS — Z713 Dietary counseling and surveillance: Secondary | ICD-10-CM | POA: Insufficient documentation

## 2017-03-18 DIAGNOSIS — O24419 Gestational diabetes mellitus in pregnancy, unspecified control: Secondary | ICD-10-CM

## 2017-03-18 DIAGNOSIS — Z3A32 32 weeks gestation of pregnancy: Secondary | ICD-10-CM | POA: Insufficient documentation

## 2017-03-18 DIAGNOSIS — Z3A00 Weeks of gestation of pregnancy not specified: Secondary | ICD-10-CM | POA: Diagnosis not present

## 2017-03-18 DIAGNOSIS — O99333 Smoking (tobacco) complicating pregnancy, third trimester: Secondary | ICD-10-CM | POA: Diagnosis not present

## 2017-03-18 DIAGNOSIS — O0990 Supervision of high risk pregnancy, unspecified, unspecified trimester: Secondary | ICD-10-CM

## 2017-03-18 DIAGNOSIS — O24410 Gestational diabetes mellitus in pregnancy, diet controlled: Secondary | ICD-10-CM | POA: Diagnosis present

## 2017-03-19 NOTE — Progress Notes (Signed)
  Patient was seen on 03/18/2017 for Gestational Diabetes self-management .She states no history of GDM with previous 2 pregnancies. Diet history indicates mostly fast food, very little cooking at home. The following learning objectives were met by the patient :   States the definition of Gestational Diabetes  States why dietary management is important in controlling blood glucose  Describes the effects of carbohydrates on blood glucose levels  Demonstrates ability to create a balanced meal plan  Demonstrates carbohydrate counting   States when to check blood glucose levels  Demonstrates proper blood glucose monitoring techniques  States the effect of stress and exercise on blood glucose levels  States the importance of limiting caffeine and abstaining from alcohol and smoking  Plan:  Aim for 3 Carb Choices per meal (45 grams) +/- 1 either way  Aim for 1-2 Carbs per snack Begin reading food labels for Total Carbohydrate of foods Consider  increasing your activity level by walking or other activity daily as tolerated Begin checking BG before breakfast and 2 hours after first bite of breakfast, lunch and dinner as directed by MD  Bring Log Book to every medical appointment   Take medication if directed by MD  Blood glucose monitor recommended is Accu Chek Guide. MD office has called in the Rx to her pharmacy but she has not picked it up yet.  She states she plans to pick it up within the next 24 hours.  Patient instructed to test pre breakfast and 2 hours each meal as directed by MD BG Log Sheet provided and she is aware to bring it to every MD appointment  Patient instructed to monitor glucose levels: FBS: 60 - 95 mg/dl 2 hour: <120 mg/dl  Patient received the following handouts:  Nutrition Diabetes and Pregnancy  Carbohydrate Counting List  Patient will be seen for follow-up as needed.

## 2017-03-29 ENCOUNTER — Encounter: Payer: Medicaid Other | Admitting: Obstetrics and Gynecology

## 2017-04-13 ENCOUNTER — Other Ambulatory Visit (HOSPITAL_COMMUNITY)
Admission: RE | Admit: 2017-04-13 | Discharge: 2017-04-13 | Disposition: A | Discharge status: Home / Self Care | Payer: Medicaid Other | Source: Ambulatory Visit | Attending: Obstetrics and Gynecology | Admitting: Obstetrics and Gynecology

## 2017-04-13 ENCOUNTER — Ambulatory Visit (INDEPENDENT_AMBULATORY_CARE_PROVIDER_SITE_OTHER): Payer: Medicaid Other | Admitting: Obstetrics and Gynecology

## 2017-04-13 VITALS — BP 129/83 | HR 87 | Wt 166.0 lb

## 2017-04-13 DIAGNOSIS — O0990 Supervision of high risk pregnancy, unspecified, unspecified trimester: Secondary | ICD-10-CM

## 2017-04-13 DIAGNOSIS — O0993 Supervision of high risk pregnancy, unspecified, third trimester: Secondary | ICD-10-CM | POA: Diagnosis not present

## 2017-04-13 DIAGNOSIS — Z3A36 36 weeks gestation of pregnancy: Secondary | ICD-10-CM | POA: Diagnosis not present

## 2017-04-13 DIAGNOSIS — O24419 Gestational diabetes mellitus in pregnancy, unspecified control: Secondary | ICD-10-CM

## 2017-04-13 NOTE — Progress Notes (Signed)
Subjective:  Shelly Tate is a 26 y.o. G3P2002 at 6140w0d being seen today for ongoing prenatal care.  She is currently monitored for the following issues for this high-risk pregnancy and has Supervision of high risk pregnancy, antepartum; Chlamydia infection affecting pregnancy in second trimester; Trichimoniasis; and GDM (gestational diabetes mellitus) on her problem list.  Patient reports no complaints.  Contractions: Irregular. Vag. Bleeding: None.  Movement: Present. Denies leaking of fluid.   The following portions of the patient's history were reviewed and updated as appropriate: allergies, current medications, past family history, past medical history, past social history, past surgical history and problem list. Problem list updated.  Objective:   Vitals:   04/13/17 1117  BP: 129/83  Pulse: 87  Weight: 166 lb (75.3 kg)    Fetal Status: Fetal Heart Rate (bpm): 130   Movement: Present     General:  Alert, oriented and cooperative. Patient is in no acute distress.  Skin: Skin is warm and dry. No rash noted.   Cardiovascular: Normal heart rate noted  Respiratory: Normal respiratory effort, no problems with respiration noted  Abdomen: Soft, gravid, appropriate for gestational age. Pain/Pressure: Present     Pelvic:  Cervical exam performed        Extremities: Normal range of motion.  Edema: Mild pitting, slight indentation  Mental Status: Normal mood and affect. Normal behavior. Normal judgment and thought content.   Urinalysis:      Assessment and Plan:  Pregnancy: G3P2002 at 6840w0d  1. Supervision of high risk pregnancy, antepartum Pt has not been able to make OB visit due to transportation issues Reports this has been resolved  - Cervicovaginal ancillary only - Strep Gp B NAA  TOC for STD's today  2. Gestational diabetes mellitus (GDM) in third trimester, gestational diabetes method of control unspecified Has not been following diet or checking BS Importance of  following diet and check BS reviewed with pt  Term labor symptoms and general obstetric precautions including but not limited to vaginal bleeding, contractions, leaking of fluid and fetal movement were reviewed in detail with the patient. Please refer to After Visit Summary for other counseling recommendations.  No Follow-up on file.   Hermina StaggersErvin, Rorik Vespa L, MD

## 2017-04-13 NOTE — Patient Instructions (Signed)
Vaginal Delivery Vaginal delivery means that you will give birth by pushing your baby out of your birth canal (vagina). A team of health care providers will help you before, during, and after vaginal delivery. Birth experiences are unique for every woman and every pregnancy, and birth experiences vary depending on where you choose to give birth. What should I do to prepare for my baby's birth? Before your baby is born, it is important to talk with your health care provider about:  Your labor and delivery preferences. These may include: ? Medicines that you may be given. ? How you will manage your pain. This might include non-medical pain relief techniques or injectable pain relief such as epidural analgesia. ? How you and your baby will be monitored during labor and delivery. ? Who may be in the labor and delivery room with you. ? Your feelings about surgical delivery of your baby (cesarean delivery, or C-section) if this becomes necessary. ? Your feelings about receiving donated blood through an IV tube (blood transfusion) if this becomes necessary.  Whether you are able: ? To take pictures or videos of the birth. ? To eat during labor and delivery. ? To move around, walk, or change positions during labor and delivery.  What to expect after your baby is born, such as: ? Whether delayed umbilical cord clamping and cutting is offered. ? Who will care for your baby right after birth. ? Medicines or tests that may be recommended for your baby. ? Whether breastfeeding is supported in your hospital or birth center. ? How long you will be in the hospital or birth center.  How any medical conditions you have may affect your baby or your labor and delivery experience.  To prepare for your baby's birth, you should also:  Attend all of your health care visits before delivery (prenatal visits) as recommended by your health care provider. This is important.  Prepare your home for your baby's  arrival. Make sure that you have: ? Diapers. ? Baby clothing. ? Feeding equipment. ? Safe sleeping arrangements for you and your baby.  Install a car seat in your vehicle. Have your car seat checked by a certified car seat installer to make sure that it is installed safely.  Think about who will help you with your new baby at home for at least the first several weeks after delivery.  What can I expect when I arrive at the birth center or hospital? Once you are in labor and have been admitted into the hospital or birth center, your health care provider may:  Review your pregnancy history and any concerns you have.  Insert an IV tube into one of your veins. This is used to give you fluids and medicines.  Check your blood pressure, pulse, temperature, and heart rate (vital signs).  Check whether your bag of water (amniotic sac) has broken (ruptured).  Talk with you about your birth plan and discuss pain control options.  Monitoring Your health care provider may monitor your contractions (uterine monitoring) and your baby's heart rate (fetal monitoring). You may need to be monitored:  Often, but not continuously (intermittently).  All the time or for long periods at a time (continuously). Continuous monitoring may be needed if: ? You are taking certain medicines, such as medicine to relieve pain or make your contractions stronger. ? You have pregnancy or labor complications.  Monitoring may be done by:  Placing a special stethoscope or a handheld monitoring device on your abdomen to   check your baby's heartbeat, and feeling your abdomen for contractions. This method of monitoring does not continuously record your baby's heartbeat or your contractions.  Placing monitors on your abdomen (external monitors) to record your baby's heartbeat and the frequency and length of contractions. You may not have to wear external monitors all the time.  Placing monitors inside of your uterus  (internal monitors) to record your baby's heartbeat and the frequency, length, and strength of your contractions. ? Your health care provider may use internal monitors if he or she needs more information about the strength of your contractions or your baby's heart rate. ? Internal monitors are put in place by passing a thin, flexible wire through your vagina and into your uterus. Depending on the type of monitor, it may remain in your uterus or on your baby's head until birth. ? Your health care provider will discuss the benefits and risks of internal monitoring with you and will ask for your permission before inserting the monitors.  Telemetry. This is a type of continuous monitoring that can be done with external or internal monitors. Instead of having to stay in bed, you are able to move around during telemetry. Ask your health care provider if telemetry is an option for you.  Physical exam Your health care provider may perform a physical exam. This may include:  Checking whether your baby is positioned: ? With the head toward your vagina (head-down). This is most common. ? With the head toward the top of your uterus (head-up or breech). If your baby is in a breech position, your health care provider may try to turn your baby to a head-down position so you can deliver vaginally. If it does not seem that your baby can be born vaginally, your provider may recommend surgery to deliver your baby. In rare cases, you may be able to deliver vaginally if your baby is head-up (breech delivery). ? Lying sideways (transverse). Babies that are lying sideways cannot be delivered vaginally.  Checking your cervix to determine: ? Whether it is thinning out (effacing). ? Whether it is opening up (dilating). ? How low your baby has moved into your birth canal.  What are the three stages of labor and delivery?  Normal labor and delivery is divided into the following three stages: Stage 1  Stage 1 is the  longest stage of labor, and it can last for hours or days. Stage 1 includes: ? Early labor. This is when contractions may be irregular, or regular and mild. Generally, early labor contractions are more than 10 minutes apart. ? Active labor. This is when contractions get longer, more regular, more frequent, and more intense. ? The transition phase. This is when contractions happen very close together, are very intense, and may last longer than during any other part of labor.  Contractions generally feel mild, infrequent, and irregular at first. They get stronger, more frequent (about every 2-3 minutes), and more regular as you progress from early labor through active labor and transition.  Many women progress through stage 1 naturally, but you may need help to continue making progress. If this happens, your health care provider may talk with you about: ? Rupturing your amniotic sac if it has not ruptured yet. ? Giving you medicine to help make your contractions stronger and more frequent.  Stage 1 ends when your cervix is completely dilated to 4 inches (10 cm) and completely effaced. This happens at the end of the transition phase. Stage 2  Once   your cervix is completely effaced and dilated to 4 inches (10 cm), you may start to feel an urge to push. It is common for the body to naturally take a rest before feeling the urge to push, especially if you received an epidural or certain other pain medicines. This rest period may last for up to 1-2 hours, depending on your unique labor experience.  During stage 2, contractions are generally less painful, because pushing helps relieve contraction pain. Instead of contraction pain, you may feel stretching and burning pain, especially when the widest part of your baby's head passes through the vaginal opening (crowning).  Your health care provider will closely monitor your pushing progress and your baby's progress through the vagina during stage 2.  Your  health care provider may massage the area of skin between your vaginal opening and anus (perineum) or apply warm compresses to your perineum. This helps it stretch as the baby's head starts to crown, which can help prevent perineal tearing. ? In some cases, an incision may be made in your perineum (episiotomy) to allow the baby to pass through the vaginal opening. An episiotomy helps to make the opening of the vagina larger to allow more room for the baby to fit through.  It is very important to breathe and focus so your health care provider can control the delivery of your baby's head. Your health care provider may have you decrease the intensity of your pushing, to help prevent perineal tearing.  After delivery of your baby's head, the shoulders and the rest of the body generally deliver very quickly and without difficulty.  Once your baby is delivered, the umbilical cord may be cut right away, or this may be delayed for 1-2 minutes, depending on your baby's health. This may vary among health care providers, hospitals, and birth centers.  If you and your baby are healthy enough, your baby may be placed on your chest or abdomen to help maintain the baby's temperature and to help you bond with each other. Some mothers and babies start breastfeeding at this time. Your health care team will dry your baby and help keep your baby warm during this time.  Your baby may need immediate care if he or she: ? Showed signs of distress during labor. ? Has a medical condition. ? Was born too early (prematurely). ? Had a bowel movement before birth (meconium). ? Shows signs of difficulty transitioning from being inside the uterus to being outside of the uterus. If you are planning to breastfeed, your health care team will help you begin a feeding. Stage 3  The third stage of labor starts immediately after the birth of your baby and ends after you deliver the placenta. The placenta is an organ that develops  during pregnancy to provide oxygen and nutrients to your baby in the womb.  Delivering the placenta may require some pushing, and you may have mild contractions. Breastfeeding can stimulate contractions to help you deliver the placenta.  After the placenta is delivered, your uterus should tighten (contract) and become firm. This helps to stop bleeding in your uterus. To help your uterus contract and to control bleeding, your health care provider may: ? Give you medicine by injection, through an IV tube, by mouth, or through your rectum (rectally). ? Massage your abdomen or perform a vaginal exam to remove any blood clots that are left in your uterus. ? Empty your bladder by placing a thin, flexible tube (catheter) into your bladder. ? Encourage   you to breastfeed your baby. After labor is over, you and your baby will be monitored closely to ensure that you are both healthy until you are ready to go home. Your health care team will teach you how to care for yourself and your baby. This information is not intended to replace advice given to you by your health care provider. Make sure you discuss any questions you have with your health care provider. Document Released: 07/07/2008 Document Revised: 04/17/2016 Document Reviewed: 10/13/2015 Elsevier Interactive Patient Education  2018 [employer]  

## 2017-04-14 LAB — OB RESULTS CONSOLE GBS: STREP GROUP B AG: POSITIVE

## 2017-04-15 LAB — STREP GP B NAA: STREP GROUP B AG: POSITIVE — AB

## 2017-04-15 LAB — CERVICOVAGINAL ANCILLARY ONLY
Bacterial vaginitis: NEGATIVE
CHLAMYDIA, DNA PROBE: POSITIVE — AB
Candida vaginitis: POSITIVE — AB
NEISSERIA GONORRHEA: NEGATIVE
TRICH (WINDOWPATH): NEGATIVE

## 2017-04-16 ENCOUNTER — Encounter (HOSPITAL_COMMUNITY): Payer: Self-pay

## 2017-04-16 ENCOUNTER — Ambulatory Visit (HOSPITAL_COMMUNITY)
Admission: RE | Admit: 2017-04-16 | Discharge: 2017-04-16 | Disposition: A | Discharge status: Home / Self Care | Payer: Medicaid Other | Source: Ambulatory Visit | Attending: Certified Nurse Midwife | Admitting: Certified Nurse Midwife

## 2017-04-19 ENCOUNTER — Telehealth: Payer: Self-pay

## 2017-04-19 ENCOUNTER — Other Ambulatory Visit: Payer: Self-pay

## 2017-04-19 DIAGNOSIS — O98812 Other maternal infectious and parasitic diseases complicating pregnancy, second trimester: Principal | ICD-10-CM

## 2017-04-19 DIAGNOSIS — A749 Chlamydial infection, unspecified: Secondary | ICD-10-CM

## 2017-04-19 MED ORDER — FLUCONAZOLE 150 MG PO TABS: 150.0000 mg | ORAL_TABLET | Freq: Once | ORAL | 0 refills | Status: AC

## 2017-04-19 MED ORDER — AZITHROMYCIN 500 MG PO TABS: 500.0000 mg | ORAL_TABLET | Freq: Once | ORAL | 0 refills | Status: AC

## 2017-04-19 NOTE — Telephone Encounter (Signed)
-----   Message from Hermina StaggersMichael L Ervin, MD sent at 04/15/2017  4:12 PM EDT ----- Diflucan 150 mg po now and repeat in 3 days Azithromycin 1 gm po x 1 Partner needs to seen and evaluated Reframe from IC until TOC TOC in 3-4 weeks Thanks Casimiro NeedleMichael

## 2017-04-19 NOTE — Telephone Encounter (Signed)
Patient notified of results and RX, no sex, and TOC.

## 2017-04-19 NOTE — Telephone Encounter (Signed)
[facility] notified of results.

## 2017-04-20 ENCOUNTER — Inpatient Hospital Stay (HOSPITAL_COMMUNITY)
Admission: AD | Admit: 2017-04-20 | Discharge: 2017-04-22 | DRG: 774 | Disposition: A | Discharge status: Home / Self Care | Payer: Medicaid Other | Source: Ambulatory Visit | Attending: Family Medicine | Admitting: Family Medicine

## 2017-04-20 ENCOUNTER — Encounter (HOSPITAL_COMMUNITY): Payer: Self-pay | Admitting: *Deleted

## 2017-04-20 ENCOUNTER — Inpatient Hospital Stay (HOSPITAL_COMMUNITY): Payer: Medicaid Other | Admitting: Anesthesiology

## 2017-04-20 ENCOUNTER — Encounter: Payer: Medicaid Other | Admitting: Obstetrics & Gynecology

## 2017-04-20 DIAGNOSIS — Z3A37 37 weeks gestation of pregnancy: Secondary | ICD-10-CM | POA: Diagnosis not present

## 2017-04-20 DIAGNOSIS — Z87891 Personal history of nicotine dependence: Secondary | ICD-10-CM | POA: Diagnosis not present

## 2017-04-20 DIAGNOSIS — O99824 Streptococcus B carrier state complicating childbirth: Secondary | ICD-10-CM | POA: Diagnosis present

## 2017-04-20 DIAGNOSIS — A568 Sexually transmitted chlamydial infection of other sites: Secondary | ICD-10-CM | POA: Diagnosis present

## 2017-04-20 DIAGNOSIS — O24429 Gestational diabetes mellitus in childbirth, unspecified control: Secondary | ICD-10-CM | POA: Diagnosis not present

## 2017-04-20 DIAGNOSIS — O4202 Full-term premature rupture of membranes, onset of labor within 24 hours of rupture: Secondary | ICD-10-CM | POA: Diagnosis present

## 2017-04-20 DIAGNOSIS — O9832 Other infections with a predominantly sexual mode of transmission complicating childbirth: Secondary | ICD-10-CM | POA: Diagnosis present

## 2017-04-20 DIAGNOSIS — O24420 Gestational diabetes mellitus in childbirth, diet controlled: Secondary | ICD-10-CM | POA: Diagnosis present

## 2017-04-20 DIAGNOSIS — O4290 Premature rupture of membranes, unspecified as to length of time between rupture and onset of labor, unspecified weeks of gestation: Secondary | ICD-10-CM | POA: Diagnosis present

## 2017-04-20 HISTORY — DX: Trichomoniasis, unspecified: A59.9

## 2017-04-20 HISTORY — DX: Chlamydial infection, unspecified: A74.9

## 2017-04-20 LAB — COMPREHENSIVE METABOLIC PANEL
ALBUMIN: 2.7 g/dL — AB (ref 3.5–5.0)
ALT: 17 U/L (ref 14–54)
ANION GAP: 7 (ref 5–15)
AST: 24 U/L (ref 15–41)
Alkaline Phosphatase: 138 U/L — ABNORMAL HIGH (ref 38–126)
BUN: 8 mg/dL (ref 6–20)
CHLORIDE: 105 mmol/L (ref 101–111)
CO2: 22 mmol/L (ref 22–32)
Calcium: 8.3 mg/dL — ABNORMAL LOW (ref 8.9–10.3)
Creatinine, Ser: 0.77 mg/dL (ref 0.44–1.00)
GFR calc non Af Amer: >60 mL/min (ref >60)
Glucose, Bld: 100 mg/dL — ABNORMAL HIGH (ref 65–99)
Potassium: 3.5 mmol/L (ref 3.5–5.1)
SODIUM: 134 mmol/L — AB (ref 135–145)
Total Bilirubin: 0.2 mg/dL — ABNORMAL LOW (ref 0.3–1.2)
Total Protein: 6.1 g/dL — ABNORMAL LOW (ref 6.5–8.1)

## 2017-04-20 LAB — CBC
HCT: 32.9 % — ABNORMAL LOW (ref 36.0–46.0)
HCT: 33.5 % — ABNORMAL LOW (ref 36.0–46.0)
HEMOGLOBIN: 10.8 g/dL — AB (ref 12.0–15.0)
Hemoglobin: 11.2 g/dL — ABNORMAL LOW (ref 12.0–15.0)
MCH: 25.7 pg — ABNORMAL LOW (ref 26.0–34.0)
MCH: 26 pg (ref 26.0–34.0)
MCHC: 32.8 g/dL (ref 30.0–36.0)
MCHC: 33.4 g/dL (ref 30.0–36.0)
MCV: 78.3 fL (ref 78.0–100.0)
MCV: 77.9 fL — ABNORMAL LOW (ref 78.0–100.0)
PLATELETS: 207 10*3/uL (ref 150–400)
Platelets: 190 K/uL (ref 150–400)
RBC: 4.2 MIL/uL (ref 3.87–5.11)
RBC: 4.3 MIL/uL (ref 3.87–5.11)
RDW: 14.2 % (ref 11.5–15.5)
RDW: 14.2 % (ref 11.5–15.5)
WBC: 7.6 10*3/uL (ref 4.0–10.5)
WBC: 9.3 K/uL (ref 4.0–10.5)

## 2017-04-20 LAB — GLUCOSE, CAPILLARY
GLUCOSE-CAPILLARY: 71 mg/dL (ref 65–99)
Glucose-Capillary: 72 mg/dL (ref 65–99)

## 2017-04-20 LAB — PROTEIN / CREATININE RATIO, URINE
Creatinine, Urine: 15 mg/dL
PROTEIN CREATININE RATIO: 2.87 mg/mg{creat} — AB (ref 0.00–0.15)
TOTAL PROTEIN, URINE: 43 mg/dL

## 2017-04-20 LAB — TYPE AND SCREEN
ABO/RH(D): B POS
Antibody Screen: NEGATIVE

## 2017-04-20 LAB — POCT FERN TEST: POCT FERN TEST: POSITIVE

## 2017-04-20 LAB — ABO/RH: ABO/RH(D): B POS

## 2017-04-20 MED ORDER — MEASLES, MUMPS & RUBELLA VAC ~~LOC~~ INJ: 0.5000 mL | INJECTION | Freq: Once | SUBCUTANEOUS | Status: DC

## 2017-04-20 MED ORDER — PRENATAL MULTIVITAMIN CH
1.0000 | ORAL_TABLET | Freq: Every day | ORAL | Status: DC
  Administered 2017-04-21 – 2017-04-22 (×2): 1 via ORAL
  Filled 2017-04-20 (×2): qty 1

## 2017-04-20 MED ORDER — IBUPROFEN 600 MG PO TABS
600.0000 mg | ORAL_TABLET | Freq: Four times a day (QID) | ORAL | Status: DC
  Administered 2017-04-21 – 2017-04-22 (×7): 600 mg via ORAL
  Filled 2017-04-20 (×7): qty 1

## 2017-04-20 MED ORDER — DIPHENHYDRAMINE HCL 50 MG/ML IJ SOLN: 12.5000 mg | INTRAMUSCULAR | Status: DC | PRN

## 2017-04-20 MED ORDER — EPHEDRINE 5 MG/ML INJ
10.0000 mg | INTRAVENOUS | Status: DC | PRN
  Filled 2017-04-20: qty 2

## 2017-04-20 MED ORDER — LACTATED RINGERS IV SOLN: 500.0000 mL | Freq: Once | INTRAVENOUS | Status: DC

## 2017-04-20 MED ORDER — DIPHENHYDRAMINE HCL 25 MG PO CAPS: 25.0000 mg | ORAL_CAPSULE | Freq: Four times a day (QID) | ORAL | Status: DC | PRN

## 2017-04-20 MED ORDER — FLEET ENEMA 7-19 GM/118ML RE ENEM: 1.0000 | ENEMA | RECTAL | Status: DC | PRN

## 2017-04-20 MED ORDER — ACETAMINOPHEN 325 MG PO TABS
650.0000 mg | ORAL_TABLET | ORAL | Status: DC | PRN
  Administered 2017-04-21 (×2): 650 mg via ORAL
  Filled 2017-04-20 (×2): qty 2

## 2017-04-20 MED ORDER — FENTANYL 2.5 MCG/ML BUPIVACAINE 1/10 % EPIDURAL INFUSION (WH - ANES)
14.0000 mL/h | INTRAMUSCULAR | Status: DC | PRN
  Administered 2017-04-20: 14 mL/h via EPIDURAL
  Filled 2017-04-20: qty 100

## 2017-04-20 MED ORDER — PENICILLIN G POTASSIUM 5000000 UNITS IJ SOLR
5.0000 10*6.[IU] | Freq: Once | INTRAVENOUS | Status: AC
  Administered 2017-04-20: 5 10*6.[IU] via INTRAVENOUS
  Filled 2017-04-20: qty 5

## 2017-04-20 MED ORDER — TERBUTALINE SULFATE 1 MG/ML IJ SOLN
0.2500 mg | Freq: Once | INTRAMUSCULAR | Status: DC | PRN
  Filled 2017-04-20: qty 1

## 2017-04-20 MED ORDER — SENNOSIDES-DOCUSATE SODIUM 8.6-50 MG PO TABS
2.0000 | ORAL_TABLET | ORAL | Status: DC
  Administered 2017-04-21: 2 via ORAL
  Filled 2017-04-20 (×2): qty 2

## 2017-04-20 MED ORDER — ONDANSETRON HCL 4 MG/2ML IJ SOLN: 4.0000 mg | INTRAMUSCULAR | Status: DC | PRN

## 2017-04-20 MED ORDER — DIBUCAINE 1 % RE OINT: 1.0000 "application " | TOPICAL_OINTMENT | RECTAL | Status: DC | PRN

## 2017-04-20 MED ORDER — LIDOCAINE HCL (PF) 1 % IJ SOLN
INTRAMUSCULAR | Status: DC | PRN
  Administered 2017-04-20 (×2): 6 mL via EPIDURAL

## 2017-04-20 MED ORDER — ACETAMINOPHEN 325 MG PO TABS: 650.0000 mg | ORAL_TABLET | ORAL | Status: DC | PRN

## 2017-04-20 MED ORDER — SIMETHICONE 80 MG PO CHEW
80.0000 mg | CHEWABLE_TABLET | ORAL | Status: DC | PRN
Start: 2017-04-20 — End: 2017-04-22

## 2017-04-20 MED ORDER — COCONUT OIL OIL: 1.0000 "application " | TOPICAL_OIL | Status: DC | PRN

## 2017-04-20 MED ORDER — WITCH HAZEL-GLYCERIN EX PADS: 1.0000 "application " | MEDICATED_PAD | CUTANEOUS | Status: DC | PRN

## 2017-04-20 MED ORDER — OXYCODONE-ACETAMINOPHEN 5-325 MG PO TABS: 1.0000 | ORAL_TABLET | ORAL | Status: DC | PRN

## 2017-04-20 MED ORDER — PHENYLEPHRINE 40 MCG/ML (10ML) SYRINGE FOR IV PUSH (FOR BLOOD PRESSURE SUPPORT)
80.0000 ug | PREFILLED_SYRINGE | INTRAVENOUS | Status: DC | PRN
  Filled 2017-04-20: qty 10
  Filled 2017-04-20: qty 5

## 2017-04-20 MED ORDER — OXYTOCIN BOLUS FROM INFUSION
500.0000 mL | Freq: Once | INTRAVENOUS | Status: AC
  Administered 2017-04-20: 500 mL via INTRAVENOUS

## 2017-04-20 MED ORDER — AZITHROMYCIN 250 MG PO TABS
1000.0000 mg | ORAL_TABLET | Freq: Once | ORAL | Status: AC
  Administered 2017-04-20: 1000 mg via ORAL
  Filled 2017-04-20: qty 4

## 2017-04-20 MED ORDER — PENICILLIN G POT IN DEXTROSE 60000 UNIT/ML IV SOLN
3.0000 10*6.[IU] | INTRAVENOUS | Status: DC
  Administered 2017-04-20: 3 10*6.[IU] via INTRAVENOUS
  Filled 2017-04-20 (×4): qty 50

## 2017-04-20 MED ORDER — FENTANYL CITRATE (PF) 100 MCG/2ML IJ SOLN
100.0000 ug | INTRAMUSCULAR | Status: DC | PRN
  Administered 2017-04-20: 100 ug via INTRAVENOUS
  Filled 2017-04-20: qty 2

## 2017-04-20 MED ORDER — BENZOCAINE-MENTHOL 20-0.5 % EX AERO: 1.0000 "application " | INHALATION_SPRAY | CUTANEOUS | Status: DC | PRN

## 2017-04-20 MED ORDER — LACTATED RINGERS IV SOLN
INTRAVENOUS | Status: DC
  Administered 2017-04-20: 09:00:00 via INTRAVENOUS

## 2017-04-20 MED ORDER — PHENYLEPHRINE 40 MCG/ML (10ML) SYRINGE FOR IV PUSH (FOR BLOOD PRESSURE SUPPORT)
80.0000 ug | PREFILLED_SYRINGE | INTRAVENOUS | Status: DC | PRN
  Filled 2017-04-20: qty 5

## 2017-04-20 MED ORDER — OXYCODONE-ACETAMINOPHEN 5-325 MG PO TABS: 2.0000 | ORAL_TABLET | ORAL | Status: DC | PRN

## 2017-04-20 MED ORDER — OXYTOCIN 40 UNITS IN LACTATED RINGERS INFUSION - SIMPLE MED: 2.5000 [IU]/h | INTRAVENOUS | Status: DC

## 2017-04-20 MED ORDER — TETANUS-DIPHTH-ACELL PERTUSSIS 5-2.5-18.5 LF-MCG/0.5 IM SUSP: 0.5000 mL | Freq: Once | INTRAMUSCULAR | Status: DC

## 2017-04-20 MED ORDER — SOD CITRATE-CITRIC ACID 500-334 MG/5ML PO SOLN: 30.0000 mL | ORAL | Status: DC | PRN

## 2017-04-20 MED ORDER — ZOLPIDEM TARTRATE 5 MG PO TABS
5.0000 mg | ORAL_TABLET | Freq: Every evening | ORAL | Status: DC | PRN
Start: 2017-04-20 — End: 2017-04-22

## 2017-04-20 MED ORDER — OXYTOCIN 40 UNITS IN LACTATED RINGERS INFUSION - SIMPLE MED
1.0000 m[IU]/min | INTRAVENOUS | Status: DC
  Administered 2017-04-20: 2 m[IU]/min via INTRAVENOUS
  Filled 2017-04-20: qty 1000

## 2017-04-20 MED ORDER — LACTATED RINGERS IV SOLN: 500.0000 mL | INTRAVENOUS | Status: DC | PRN

## 2017-04-20 MED ORDER — ONDANSETRON HCL 4 MG/2ML IJ SOLN: 4.0000 mg | Freq: Four times a day (QID) | INTRAMUSCULAR | Status: DC | PRN

## 2017-04-20 MED ORDER — LIDOCAINE HCL (PF) 1 % IJ SOLN
30.0000 mL | INTRAMUSCULAR | Status: DC | PRN
  Filled 2017-04-20: qty 30

## 2017-04-20 MED ORDER — ONDANSETRON HCL 4 MG PO TABS
4.0000 mg | ORAL_TABLET | ORAL | Status: DC | PRN
Start: 2017-04-20 — End: 2017-04-22

## 2017-04-20 NOTE — Anesthesia Pain Management Evaluation Note (Signed)
  CRNA Pain Management Visit Note  Patient: Shelly Tate, 26 y.o., female  "Hello I am a [facility] of the anesthesia [facility]. We have an anesthesia team available at all times to provide care throughout the hospital, including epidural management and anesthesia for C-section. I don't know your plan for the delivery whether it a natural birth, water birth, IV sedation, nitrous supplementation, doula or epidural, but we want to meet your pain goals."   1.Was your pain managed to your expectations on prior hospitalizations?   Yes   2.What is your expectation for pain management during this hospitalization?     Epidural  3.How can we help you reach that goal? epidural  Record the patient's initial score and the patient's pain goal.   Pain: 0  Pain Goal: 8 [facility] wants you to be able to say your pain was always managed very well.  Blaize Epple 04/20/2017

## 2017-04-20 NOTE — Anesthesia Procedure Notes (Signed)
Epidural Patient location during procedure: OB Start time: 04/20/2017 5:00 PM End time: 04/20/2017 5:04 PM  Staffing Anesthesiologist: Leilani AbleHATCHETT, Mantaj Chamberlin Performed: anesthesiologist   Preanesthetic Checklist Completed: patient identified, surgical consent, pre-op evaluation, timeout performed, IV checked, risks and benefits discussed and monitors and equipment checked  Epidural Patient position: sitting Prep: site prepped and draped and DuraPrep Patient monitoring: continuous pulse ox and blood pressure Approach: midline Location: L3-L4 Injection technique: LOR air  Needle:  Needle type: Tuohy  Needle gauge: 17 G Needle length: 9 cm and 9 Needle insertion depth: 6 cm Catheter type: closed end flexible Catheter size: 19 Gauge Catheter at skin depth: 11 cm Test dose: negative and Other  Assessment Sensory level: T10 Events: blood not aspirated, injection not painful, no injection resistance, negative IV test and no paresthesia

## 2017-04-20 NOTE — Progress Notes (Signed)
Patient ID: Shelly Tate, female   DOB: 27-Dec-1990, 26 y.o.   MRN: [id number] Labor Progress Note  S: Patient seen & examined for progress of labor. Patient comfortable. FHT Monitor replaced on patient, keeps sliding off. Headache has resolved.   O: BP 131/77   Pulse (!) 59   Temp 98.1 F (36.7 C) (Axillary)   Resp 18   Ht 5\' 2"  (1.575 m)   Wt 75.3 kg (166 lb)   LMP 08/04/2016 (Approximate)   BMI 30.36 kg/m   FHT: 145bpm, mod var, +accels, no decels TOCO: q2-5 min, patient looks comfortable during contractions  CVE: Dilation: 3 Effacement (%): 20 Cervical Position: Posterior Station: -3 Presentation: Vertex Exam by:: Dr. Ashok PallWouk  A&P: 26 y.o. [id number] 3775w0d with PROM @0700 .   A1GDM monitored with CBG q4  BPs wnl since initial elevated pressures on admission. H/o pre-eclampsia. UPC 2.87.  Given 1 g azithromycin for chlamydia GBS-positive- about to be given second dose of PCN Currently on 8 milli-units/ min of pitocin Continue current management Anticipate SVD  [place] Rama Sorci, DO FM Resident PGY-1 04/20/2017 1:07 PM

## 2017-04-20 NOTE — MAU Note (Signed)
Started leaking around 0700. Clear fluid. No bleeding.  Some cramps.  Was 1 cm when last checked.  +GBS

## 2017-04-20 NOTE — H&P (Signed)
LABOR ADMISSION HISTORY AND PHYSICAL  Shelly Tate is a 26 y.o. female G3P2002 with IUP at 4039w0d by US presenting for PROM @ 0700 with clear fluid. H/o of pre-eclampsia with second pregnancy. A1GDM, elevated blood pressures, and chlamydia positive 07/03/- not treated. She reports +FMs, LOF, no VB, no blurry vision,  +headaches, + peripheral edema, and no RUQ pain.  She plans on breast and bottle feeding. She requests either Nexplanon or Depo for birth control.  Dating: By [place] --->  Estimated Date of Delivery: 05/11/17   Prenatal History/Complications:  Past Medical History: Past Medical History:  Diagnosis Date  . Chlamydia   . Medical history non-contributory   . Trichomonas infection     Past Surgical History: Past Surgical History:  Procedure Laterality Date  . NO PAST SURGERIES      Obstetrical History: OB History    Gravida Para Term Preterm AB Living   3 2 2     2    SAB TAB Ectopic Multiple Live Births           2      Social History: Social History   Social History  . Marital status: Single    Spouse name: N/A  . Number of children: N/A  . Years of education: N/A   Social History Main Topics  . Smoking status: Former Smoker    Packs/day: 0.25    Types: Cigarettes    Quit date: 11/18/2016  . Smokeless tobacco: Never Used  . Alcohol use No  . Drug use: No  . Sexual activity: Yes    Birth control/ protection: None   Other Topics Concern  . None   Social History Narrative  . None    Family History: Family History  Problem Relation Age of Onset  . Diabetes Mother   . Stroke Paternal Grandmother   . Alcohol abuse Neg Hx   . Arthritis Neg Hx   . Asthma Neg Hx   . Birth defects Neg Hx   . Cancer Neg Hx   . COPD Neg Hx   . Depression Neg Hx   . Drug abuse Neg Hx   . Early death Neg Hx   . Hearing loss Neg Hx   . Heart disease Neg Hx   . Hyperlipidemia Neg Hx   . Hypertension Neg Hx   . Kidney disease Neg Hx   . Learning disabilities Neg Hx    . Mental illness Neg Hx   . Mental retardation Neg Hx   . Miscarriages / Stillbirths Neg Hx   . Vision loss Neg Hx     Allergies: No Known Allergies  Prescriptions Prior to Admission  Medication Sig Dispense Refill Last Dose  . amoxicillin (AMOXIL) 500 MG capsule Take 1 capsule (500 mg total) by mouth 3 (three) times daily. (Patient not taking: Reported on 03/10/2017) 21 capsule 0 Not Taking  . glucose blood (ACCU-CHEK GUIDE) test strip Use 1 strip to test blood glucose 4 times daily 100 each 5 Taking  . Lancets (ACCU-CHEK MULTICLIX) lancets Use 1 lancet to test blood glucose 4 times daily. 100 each 5 Taking  . Prenatal-DSS-FeCb-FeGl-FA (CITRANATAL BLOOM) 90-1 MG TABS Take 1 tablet by mouth daily before breakfast. 90 tablet 3 Taking  . tinidazole (TINDAMAX) 500 MG tablet Take 4 tablets (2,000 mg total) by mouth daily with breakfast. (Patient not taking: Reported on 03/10/2017) 12 tablet 0 Not Taking     Review of Systems   All systems reviewed and negative except as  stated in HPI  Blood pressure (!) 156/98, pulse 74, temperature 98.4 F (36.9 C), resp. rate 18, weight 77.8 kg (171 lb 8 oz), last menstrual period 08/04/2016, unknown if currently breastfeeding. General appearance: alert, cooperative and no distress Extremities: Homans sign is negative, 2+ pitting edema BLLE Presentation: cephalic Fetal monitoringBaseline: 145 bpm, Variability: mod, Accelerations: Reactive and Decelerations: Absent Uterine activity: None     Prenatal labs: ABO, Rh: B/Positive/-- (12/20 1516) Antibody: Negative (12/20 1516) Rubella: Immune RPR: Non Reactive (05/18 1130)  HBsAg: Negative (12/20 1516)  HIV: Non Reactive (05/18 1130)  GBS: Positive (07/04 0000)   Prenatal Transfer Tool  Maternal Diabetes: Yes:  Diabetes Type:  Diet controlled Genetic Screening: Normal Maternal Ultrasounds/Referrals: Normal Fetal Ultrasounds or other Referrals:  None Maternal Substance Abuse:  No Significant  Maternal Medications:  None Significant Maternal Lab Results: Lab values include: Group B Strep negative, Other: Chlamydia +   Results for orders placed or performed during the hospital encounter of 04/20/17 (from the past 24 hour(s))  POCT fern test   Collection Time: 04/20/17  8:51 AM  Result Value Ref Range   POCT Fern Test Positive = ruptured amniotic membanes   CBC   Collection Time: 04/20/17  8:59 AM  Result Value Ref Range   WBC 7.6 4.0 - 10.5 K/uL   RBC 4.20 3.87 - 5.11 MIL/uL   Hemoglobin 10.8 (L) 12.0 - 15.0 g/dL   HCT 65.7 (L) 84.6 - 96.2 %   MCV 78.3 78.0 - 100.0 fL   MCH 25.7 (L) 26.0 - 34.0 pg   MCHC 32.8 30.0 - 36.0 g/dL   RDW 95.2 84.1 - 32.4 %   Platelets 207 150 - 400 K/uL    Patient Active Problem List   Diagnosis Date Noted  . PROM (premature rupture of membranes) 04/20/2017  . GDM (gestational diabetes mellitus) 03/01/2017  . Chlamydia infection affecting pregnancy in second trimester 01/01/2017  . Trichimoniasis 01/01/2017  . Supervision of high risk pregnancy, antepartum 09/30/2016    Assessment: Shelly Tate is a 26 y.o. G3P2002 at [redacted]w[redacted]d here for PROM with A1GDM, h/o pre-eclampsia with current elevated pressures and untreated chlamydia infection diagnosed 04/13/17.  #Labor: Expectant management #Pain: Epidural upon request #FWB: Cat 1 #ID: GBS pos #MOF: Both #MOC:Depo or Nexplanon #Circ:  N/A #A1GDM: CBG q4 #Chlamydia: Active infection 07/03- untreated- will treat with 1 g po azithromycin   [name], DO Family Medicine Resident PGY-1  04/20/2017, 9:24 AM

## 2017-04-20 NOTE — Anesthesia Preprocedure Evaluation (Signed)
Anesthesia Evaluation  Patient identified by MRN, date of birth, ID band Patient awake    Reviewed: Allergy & Precautions, H&P , NPO status , Patient's Chart, lab work & pertinent test results  Airway Mallampati: I  TM Distance: >3 FB Neck ROM: full    Dental no notable dental hx. (+) Teeth Intact   Pulmonary former smoker,    Pulmonary exam normal breath sounds clear to auscultation       Cardiovascular negative cardio ROS Normal cardiovascular exam Rhythm:regular Rate:Normal     Neuro/Psych negative neurological ROS  negative psych ROS   GI/Hepatic negative GI ROS, Neg liver ROS,   Endo/Other  diabetes, Gestational  Renal/GU negative Renal ROS  negative genitourinary   Musculoskeletal negative musculoskeletal ROS (+)   Abdominal Normal abdominal exam  (+)   Peds  Hematology negative hematology ROS (+)   Anesthesia Other Findings   Reproductive/Obstetrics (+) Pregnancy                             Anesthesia Physical Anesthesia Plan  ASA: II  Anesthesia Plan: Epidural   Post-op Pain Management:    Induction:   PONV Risk Score and Plan:   Airway Management Planned:   Additional Equipment:   Intra-op Plan:   Post-operative Plan:   Informed Consent: I have reviewed the patients History and Physical, chart, labs and discussed the procedure including the risks, benefits and alternatives for the proposed anesthesia with the patient or authorized representative who has indicated his/her understanding and acceptance.     Plan Discussed with:   Anesthesia Plan Comments:         Anesthesia Quick Evaluation

## 2017-04-21 LAB — RPR: RPR Ser Ql: NONREACTIVE

## 2017-04-21 LAB — GLUCOSE, CAPILLARY: Glucose-Capillary: 77 mg/dL (ref 65–99)

## 2017-04-21 MED ORDER — TRIAMTERENE-HCTZ 37.5-25 MG PO TABS
1.0000 | ORAL_TABLET | Freq: Every day | ORAL | Status: DC
  Administered 2017-04-21 – 2017-04-22 (×2): 1 via ORAL
  Filled 2017-04-21 (×3): qty 1

## 2017-04-21 NOTE — Lactation Note (Signed)
This note was copied from a baby's chart. Lactation Consultation Note  Patient Name: Shelly Tate ZOXWR'UToday's Date: 04/21/2017 Reason for consult: Initial assessment;Other (Comment) (Early Term infant)   Initial consult with mom of 3616 hour old early term infant. Infant with 1 BF for 10 minutes, formula via bottle x 6 (4-31 cc), 4 voids, 3 stools and 1 emesis since birth. Maternal history of GDM-diet controlled.   Mom reports she has been trying to latch infant without much success. She reports she plans to mostly bottle feed. Mom reports her 2 and 683 yo would not latch. Discussed supply and demand, colostrum, milk coming to volume, NB nutritional needs and NB stomach size. Gave mom supplementation guidelines and reviewed how much infant needs at each feeding.   Showed mom how to hand express and she returned demo, a few gtts colostrum easily expressible.mom with soft compressible breasts and areola with small everted nipples.    Mom asked if she could start pumping. DEBP set up with instructions for use on Initiate setting, assembling, disassembling and cleaning of pump parts.Enc mom to hand express post pumping. Reviewed breast milk storage at room temperature. Mom began pumping and then infant began rooting.   Mom agreeable to latching infant. Assisted mom in positioning and latching infant to the left breast in the cross cradle hold, infant latched easily with flanged lips, rhythmic suckles and a few intermittent swallows. Enc mom to feed infant STS 8-12 x in 24 hours at first feeding cues offering both breasts with each feeding. Enc mom to use good pillow and head support with feedings. Enc mom to massage/compress breast with feeding.    BF resources Handout and LC Brochure given, mom informed of IP/OP services, BF Support Groups and LC phone #. [facility] and is aware to call and make and appt post d/c. Mom does not have a pump at home.   Mom without further questions/concerns  at this time. Enc her to call out for feeding assistance as needed.    Maternal Data Formula Feeding for Exclusion: Yes Reason for exclusion: Mother's choice to formula and breast feed on admission Has patient been taught Hand Expression?: Yes Does the patient have breastfeeding experience prior to this delivery?: No (2 and 24 yo would not latch per mom, did not pump)  Feeding Feeding Type: Breast Fed Length of feed: 10 min (still feeding when LC left room)  LATCH Score/Interventions Latch: Grasps breast easily, tongue down, lips flanged, rhythmical sucking.  Audible Swallowing: A few with stimulation Intervention(s): Skin to skin;Hand expression;Alternate breast massage  Type of Nipple: Everted at rest and after stimulation  Comfort (Breast/Nipple): Soft / non-tender     Hold (Positioning): Assistance needed to correctly position infant at breast and maintain latch. Intervention(s): Breastfeeding basics reviewed;Support Pillows;Position options;Skin to skin  LATCH Score: 8  Lactation Tools Discussed/Used WIC Program: Yes Pump Review: Setup, frequency, and cleaning;Milk Storage Initiated by:: Noralee StainSharon Matty Deamer, RN, IBCLC Date initiated:: 04/21/17   Consult Status Consult Status: Follow-up Date: 04/22/17 Follow-up type: In-patient    Shelly Tate 04/21/2017, 11:07 AM

## 2017-04-21 NOTE — Anesthesia Postprocedure Evaluation (Signed)
Anesthesia Post Note  Patient: Shelly Tate  Procedure(s) Performed: * No procedures listed *     Patient location during evaluation: Mother Baby Anesthesia Type: Epidural Level of consciousness: awake, awake and alert, oriented and patient cooperative Pain management: pain level controlled Vital Signs Assessment: post-procedure vital signs reviewed and stable Respiratory status: spontaneous breathing, nonlabored ventilation and respiratory function stable Cardiovascular status: stable Postop Assessment: no headache, no backache, no signs of nausea or vomiting and patient able to bend at knees Anesthetic complications: no    Last Vitals:  Vitals:   04/20/17 2050 04/21/17 0136  BP: 131/85 140/76  Pulse: (!) 57 (!) 59  Resp: 18 18  Temp: 37.2 C 37.3 C    Last Pain:  Vitals:   04/21/17 0821  TempSrc:   PainSc: 0-No pain   Pain Goal:                 [name]

## 2017-04-21 NOTE — Progress Notes (Signed)
Post Partum Day #1 Subjective: no complaints, up ad lib and tolerating PO; breast and bottlefeeding; desires Nex or Depo for contraception; +chlam on 7/3 tx this admission w/ azithromycin  Objective: Blood pressure 140/76, pulse (!) 59, temperature 99.1 F (37.3 C), temperature source Axillary, resp. rate 18, height 5\' 2"  (1.575 m), weight 75.3 kg (166 lb), last menstrual period 08/04/2016, unknown if currently breastfeeding.  Physical Exam:  General: alert, cooperative and no distress Lochia: appropriate Uterine Fundus: firm DVT Evaluation: No evidence of DVT seen on physical exam.   Recent Labs  04/20/17 0859 04/20/17 1549  HGB 10.8* 11.2*  HCT 32.9* 33.5*   FBS: 77  Assessment/Plan: Start Maxzide today for borderline BPs Plan for discharge tomorrow   LOS: 1 day   Cam HaiSHAW, Sonjia Wilcoxson CNM 04/21/2017, 8:32 AM

## 2017-04-22 MED ORDER — IBUPROFEN 600 MG PO TABS: 600.0000 mg | ORAL_TABLET | Freq: Four times a day (QID) | ORAL | 0 refills | Status: DC

## 2017-04-22 NOTE — Discharge Summary (Signed)
OB Discharge Summary     Patient Name: Shelly Tate DOB: 12-14-1990 MRN: [id number]  Date of admission: 04/20/2017 Delivering MD: SHIRLEY, [place]   Date of discharge: 04/22/2017  Admitting diagnosis: 37WKS, WATER BROKE,CTX Intrauterine pregnancy: [redacted]w[redacted]d     Secondary diagnosis:  Active Problems:   * No active hospital problems. *  Additional problems: Recurrent Chlamydia infections, GDMA1     Discharge diagnosis: Term Pregnancy Delivered                                                                                                Post partum procedures:none  Augmentation: Pitocin  Complications: None  Hospital course:  Induction of Labor With Vaginal Delivery   26 y.o. yo G3P3003 at [redacted]w[redacted]d was admitted to the hospital 04/20/2017 for induction of labor.  Indication for induction: PROM and A1 DM.  Patient had an uncomplicated labor course as follows: Membrane Rupture Time/Date: 7:00 AM ,04/20/2017   Intrapartum Procedures: Episiotomy: None [1]                                         Lacerations:  None [1]  Patient had delivery of a Viable infant.  Information for the patient's newborn:  Chaylee, Ehrsam [[id number]]  Delivery Method: Vaginal, Spontaneous Delivery (Filed from Delivery Summary)   04/20/2017  Details of delivery can be found in separate delivery note.  Patient had a routine postpartum course. Patient is discharged home 04/22/17.  Physical exam  Vitals:   04/20/17 2050 04/21/17 0136 04/21/17 1824 04/22/17 0553  BP: 131/85 140/76 133/76 133/87  Pulse: (!) 57 (!) 59 (!) 59 (!) 57  Resp: 18 18 18 18   Temp: 99 F (37.2 C) 99.1 F (37.3 C) 98.2 F (36.8 C) 97.9 F (36.6 C)  TempSrc: Oral Axillary Oral   Weight:      Height:       General: alert, cooperative and no distress Lochia: appropriate Uterine Fundus: firm Incision: N/A DVT Evaluation: No evidence of DVT seen on physical exam. Labs: Lab Results  Component Value Date   WBC 9.3  04/20/2017   HGB 11.2 (L) 04/20/2017   HCT 33.5 (L) 04/20/2017   MCV 77.9 (L) 04/20/2017   PLT 190 04/20/2017   CMP Latest Ref Rng & Units 04/20/2017  Glucose 65 - 99 mg/dL 782(N)  BUN 6 - 20 mg/dL 8  Creatinine 5.62 - 1.30 mg/dL 8.65  Sodium 784 - 696 mmol/L 134(L)  Potassium 3.5 - 5.1 mmol/L 3.5  Chloride 101 - 111 mmol/L 105  CO2 22 - 32 mmol/L 22  Calcium 8.9 - 10.3 mg/dL 8.3(L)  Total Protein 6.5 - 8.1 g/dL 6.1(L)  Total Bilirubin 0.3 - 1.2 mg/dL 2.9(B)  Alkaline Phos 38 - 126 U/L 138(H)  AST 15 - 41 U/L 24  ALT 14 - 54 U/L 17    Discharge instruction: per After Visit Summary and "Baby and Me Booklet".  After visit meds:  Allergies as of 04/22/2017   No Known Allergies  Medication List    STOP taking these medications   accu-chek multiclix lancets   amoxicillin 500 MG capsule Commonly known as:  AMOXIL   glucose blood test strip Commonly known as:  ACCU-CHEK GUIDE   tinidazole 500 MG tablet Commonly known as:  TINDAMAX     TAKE these medications   CITRANATAL BLOOM 90-1 MG Tabs Take 1 tablet by mouth daily before breakfast.   ibuprofen 600 MG tablet Commonly known as:  ADVIL,MOTRIN Take 1 tablet (600 mg total) by mouth every 6 (six) hours.       Diet: routine diet  Activity: Advance as tolerated. Pelvic rest for 6 weeks.   Outpatient follow up:4-5 weeks Follow up Appt:No future appointments. Follow up Visit:No Follow-up on file.  Postpartum contraception: depo  Newborn Data: Live born female  Birth Weight: 6 lb 4.9 oz (2860 g) APGAR: 8, 9  Baby Feeding: Bottle and Breast Disposition:home with mother  Needs TOC for chlamydia at pp visit (FOB getting treated) Needs 2 hr GTT at pp visit.  04/22/2017 Elsie LincolnKelly Faaris Arizpe, MD

## 2017-04-22 NOTE — Lactation Note (Signed)
This note was copied from a baby's chart. Lactation Consultation Note  Patient Name: Girl Janeece Fittingerriquara Mecham FAOZH'YToday's Date: 04/22/2017 Reason for consult: Follow-up assessment  Visited with Mom on day of possible discharge, baby 3040 hrs old, baby at 3% weight loss.  Mom is supplementing with formula by bottle (30-38 ml).  Mom states baby goes to breast at every feeding before bottle.  Mom states baby can feed 40 mins, acting sleepy at the breast.  Baby cueing, and offered to assist with latch. Mom positioned baby in cross cradle, using a C hold close to nipple.  Hand expression demonstrated, colostrum easily expressed.  Baby latched shallow onto base of nipple.  Assisted to relatch, using U hold.  Basic teaching done while assistance given.  Offered pillow support as well, with explanation on importance.  Baby latch easily, and occasional swallows identified.  After 10 mins, baby sleepy.  Recommended burping baby, and then placed baby back on left breast in football hold.  Baby latched deeply, and multiple swallows identified. Mom has a DEBP at bedside, has "tried" pumping one time.  Talked about advantage of adding pumping to feeding plan.  [facility].  [facility].  Mom declined as she isn't interested.  Mom has a manual pump at home.  Demonstrated how to assemble parts to make a manual pump.   Offered OP lactation appointment.  Mom interested, and agreed.  OP lactation appointment made for 7/17 @ 11:30 am.  Plan- 1- BF STS on cue, or >8 times per 24 hrs 2- Follow with supplement per guideline (10-20 ml today) 3- Pump both breasts 15 mins 4- Keep baby STS as much as possible 5- Follow-up with OP lactation appt 04/27/17 6- Call with concerns.   Consult Status Consult Status: Follow-up Date: 04/27/17 Follow-up type: Out-patient    Judee ClaraSmith, Austin Herd E 04/22/2017, 9:49 AM

## 2017-04-26 ENCOUNTER — Encounter (HOSPITAL_COMMUNITY): Payer: Self-pay

## 2017-04-26 ENCOUNTER — Ambulatory Visit (HOSPITAL_COMMUNITY): Payer: Medicaid Other

## 2017-04-27 ENCOUNTER — Encounter: Payer: Medicaid Other | Admitting: Obstetrics and Gynecology

## 2017-04-30 ENCOUNTER — Telehealth: Payer: Self-pay | Admitting: Obstetrics

## 2017-05-04 ENCOUNTER — Encounter: Payer: Medicaid Other | Admitting: Obstetrics and Gynecology

## 2017-05-19 ENCOUNTER — Ambulatory Visit: Payer: Medicaid Other | Admitting: Obstetrics

## 2017-05-23 IMAGING — US US OB TRANSVAGINAL
1 series · 14 of 28 positions shown · non-contrast
Comparison: None.

CLINICAL DATA: Pregnant patient with abdominal pain for multiple
days.

EXAM:
OBSTETRIC <14 WK US AND TRANSVAGINAL OB US
TECHNIQUE: Both transabdominal and transvaginal ultrasound examinations were
performed for complete evaluation of the gestation as well as the
maternal uterus, adnexal regions, and pelvic cul-de-sac.
Transvaginal technique was performed to assess early pregnancy.

[Series 1: us ob transvaginal · 0.17mm/px · 14 of 48 slices shown]
[im 2/48]
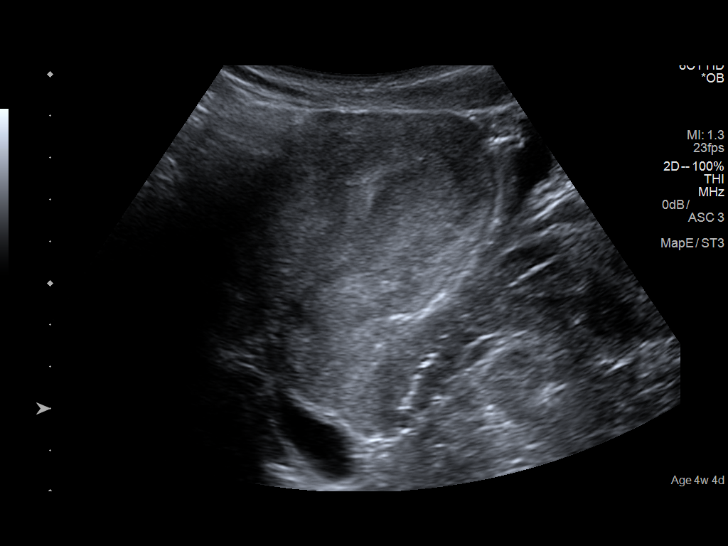
[im 6/48]
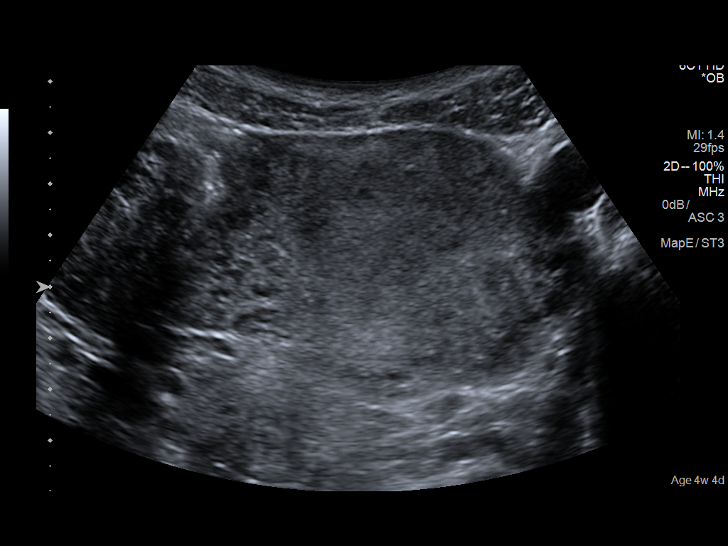
[im 9/48]
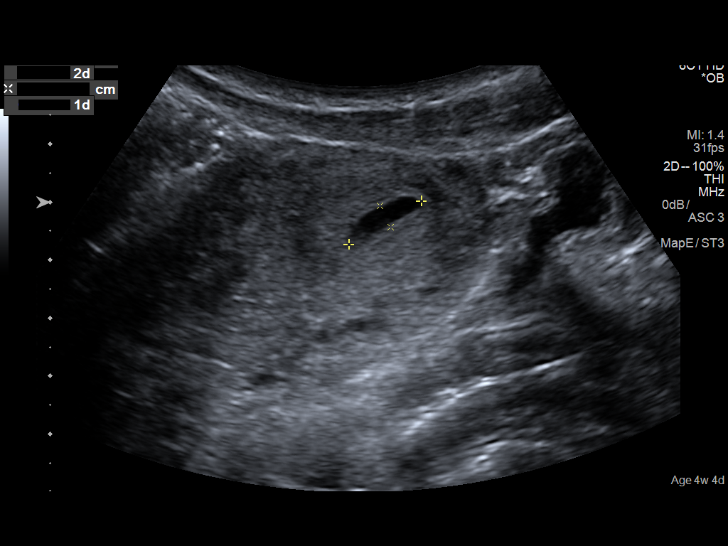
[im 13/48]
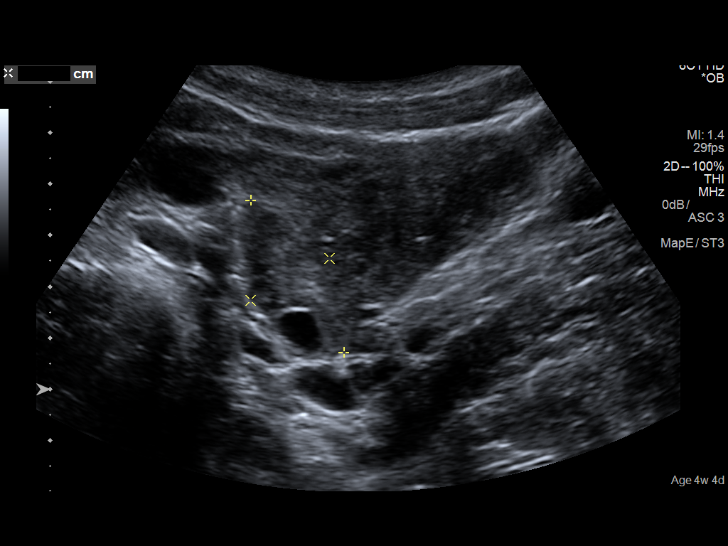
[im 16/48]
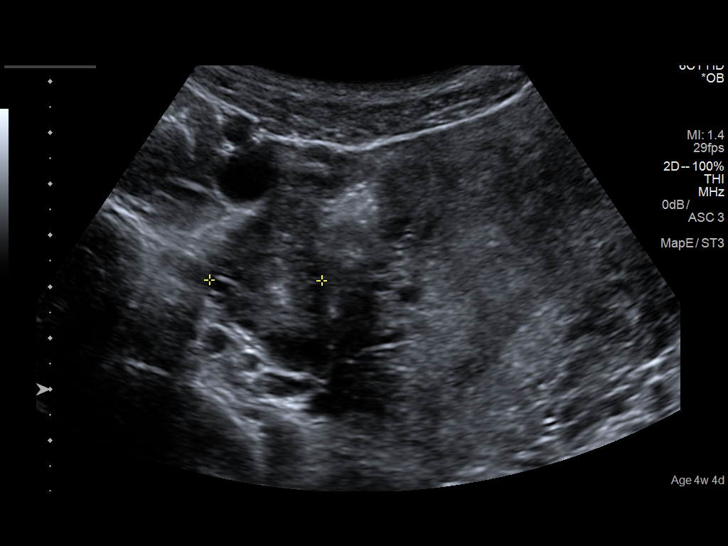
[im 20/48]
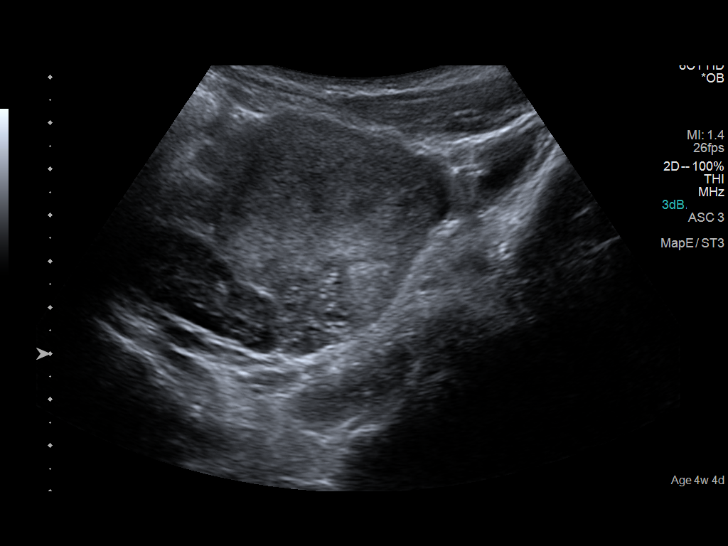
[im 23/48]
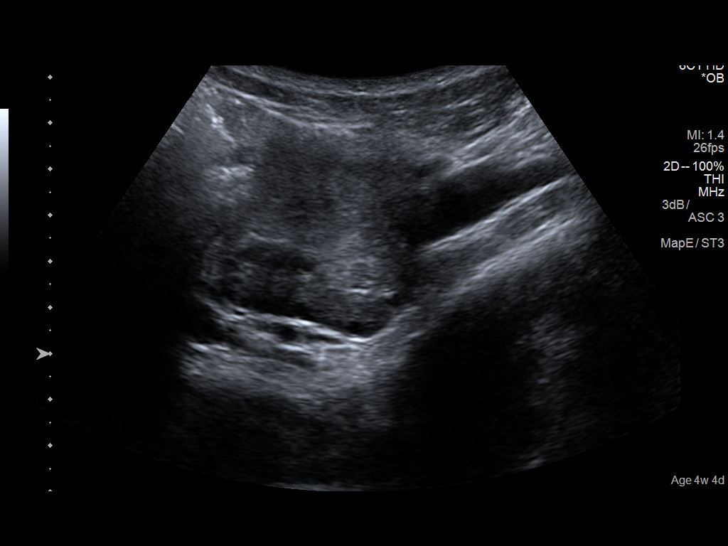
[im 27/48]
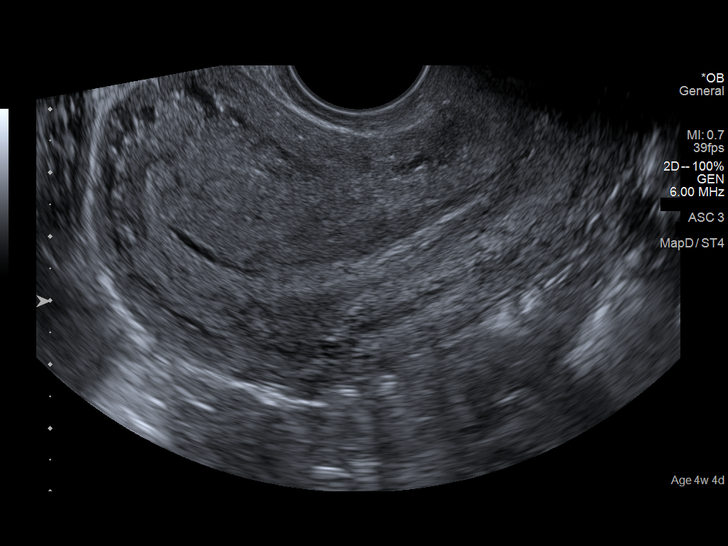
[im 30/48]
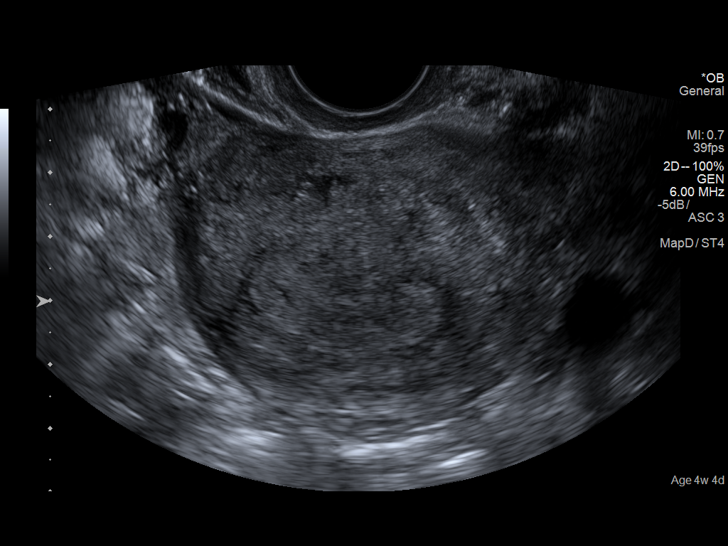
[im 34/48]
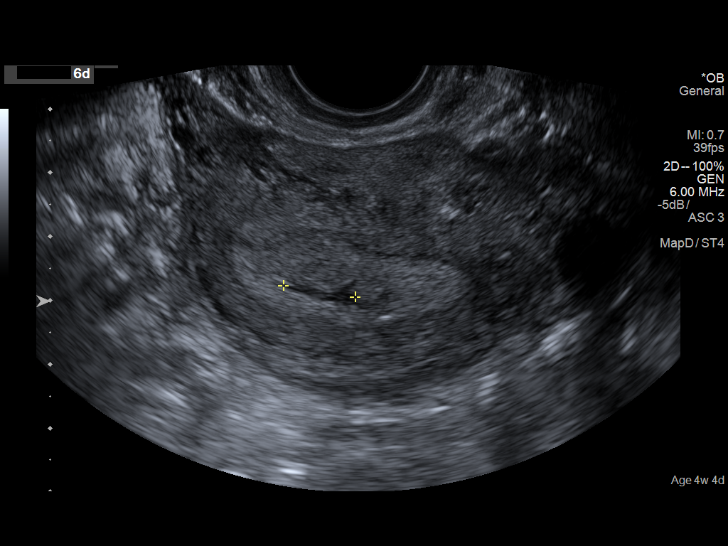
[im 37/48]
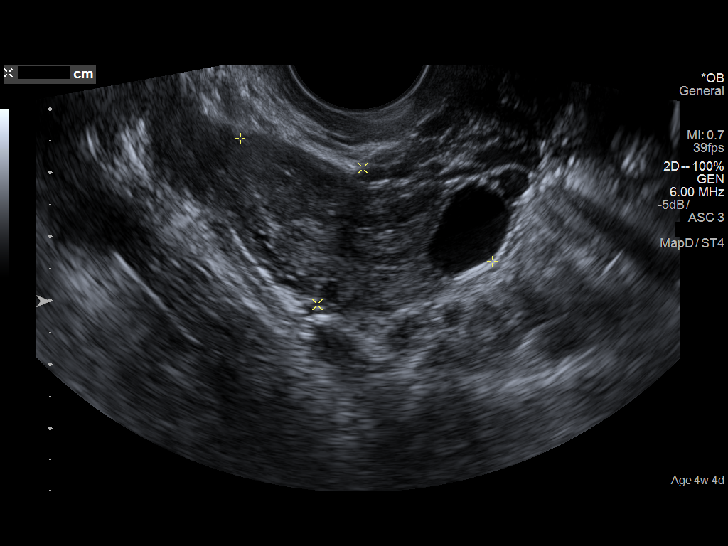
[im 41/48]
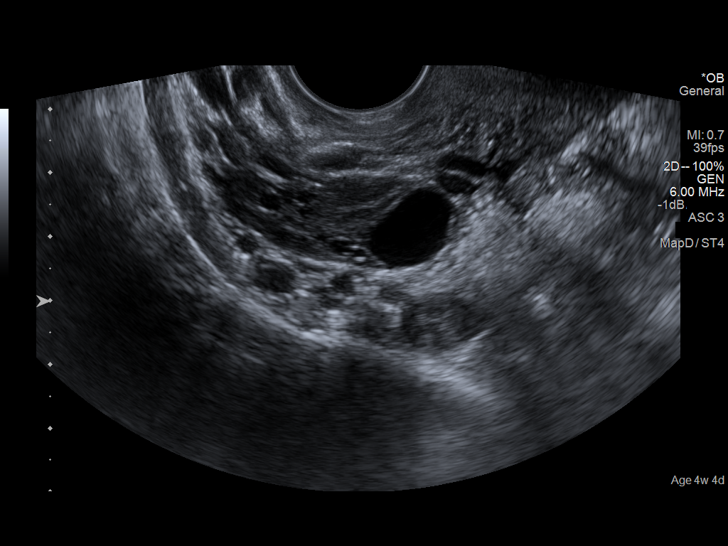
[im 44/48]
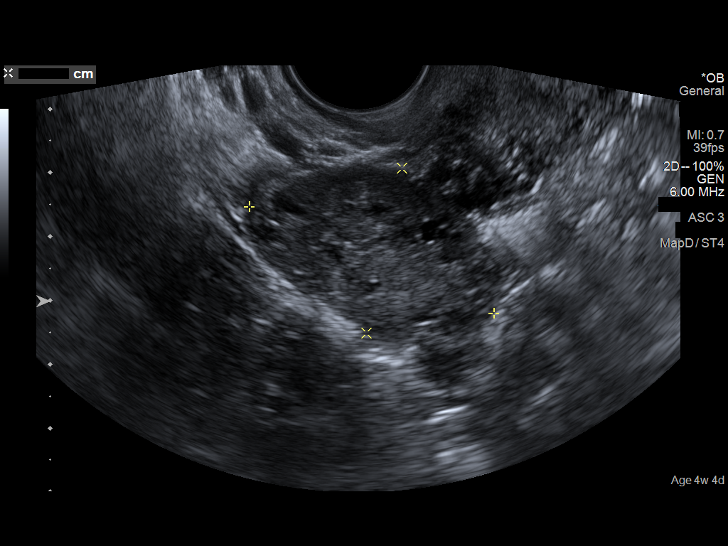
[im 48/48]
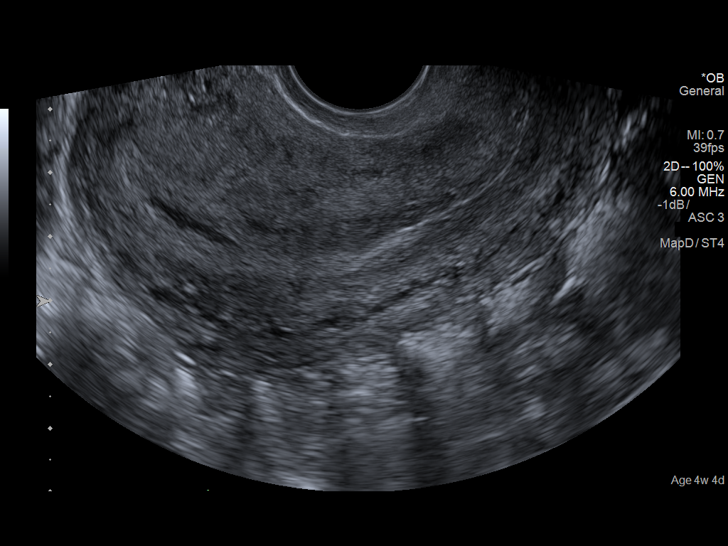

[14 of 28 positions shown; findings below may reference images not displayed]

FINDINGS: Intrauterine gestational sac: Single

Yolk sac:  Not Visualized.

Embryo:  Not Visualized.

Cardiac Activity: Not Visualized.

MSD: 10  mm   5 w   5  d

Subchorionic hemorrhage:  None visualized.

Maternal uterus/adnexae: Normal right and left ovaries. Trace free
fluid in the pelvis.
IMPRESSION: Probable early intrauterine gestational sac, but no yolk sac, fetal
pole, or cardiac activity yet visualized. Recommend follow-up
quantitative B-HCG levels and follow-up US in 14 days to assess
viability. This recommendation follows SRU consensus guidelines:
Diagnostic Criteria for Nonviable Pregnancy Early in the First
Trimester. N Engl J Med 7655; [DATE].

## 2017-07-05 ENCOUNTER — Emergency Department (HOSPITAL_COMMUNITY)
Admission: EM | Admit: 2017-07-05 | Discharge: 2017-07-05 | Discharge status: Against Medical Advice | Payer: Medicaid Other

## 2017-07-05 NOTE — ED Notes (Signed)
Called and no answer.

## 2017-07-05 NOTE — ED Notes (Signed)
Attempted to call X3 no answer, placed name back in waiting room.

## 2017-07-29 ENCOUNTER — Ambulatory Visit: Payer: Medicaid Other | Admitting: Obstetrics

## 2017-07-30 ENCOUNTER — Telehealth: Payer: Self-pay | Admitting: *Deleted

## 2017-07-30 NOTE — Telephone Encounter (Signed)
Called patient to reschedule or find out what her plans were, female answered the phone and stated she was unavailable and asked can he have her call us back asap.

## 2017-08-08 ENCOUNTER — Emergency Department (HOSPITAL_COMMUNITY)
Admission: EM | Admit: 2017-08-08 | Discharge: 2017-08-08 | Disposition: A | Discharge status: Home / Self Care | Payer: Medicaid Other | Attending: Emergency Medicine | Admitting: Emergency Medicine

## 2017-08-08 ENCOUNTER — Encounter (HOSPITAL_COMMUNITY): Payer: Self-pay

## 2017-08-08 DIAGNOSIS — N3000 Acute cystitis without hematuria: Secondary | ICD-10-CM | POA: Insufficient documentation

## 2017-08-08 DIAGNOSIS — Z87891 Personal history of nicotine dependence: Secondary | ICD-10-CM | POA: Diagnosis not present

## 2017-08-08 DIAGNOSIS — R300 Dysuria: Secondary | ICD-10-CM | POA: Diagnosis present

## 2017-08-08 DIAGNOSIS — Z202 Contact with and (suspected) exposure to infections with a predominantly sexual mode of transmission: Secondary | ICD-10-CM | POA: Diagnosis not present

## 2017-08-08 LAB — URINALYSIS, ROUTINE W REFLEX MICROSCOPIC
BILIRUBIN URINE: NEGATIVE
GLUCOSE, UA: NEGATIVE mg/dL
Hgb urine dipstick: NEGATIVE
KETONES UR: 5 mg/dL — AB
Nitrite: NEGATIVE
PH: 5 (ref 5.0–8.0)
Protein, ur: 30 mg/dL — AB
Specific Gravity, Urine: 1.03 (ref 1.005–1.030)

## 2017-08-08 LAB — POC URINE PREG, ED: PREG TEST UR: NEGATIVE

## 2017-08-08 MED ORDER — METRONIDAZOLE 500 MG PO TABS
2000.0000 mg | ORAL_TABLET | Freq: Once | ORAL | Status: AC
  Administered 2017-08-08: 2000 mg via ORAL
  Filled 2017-08-08: qty 4

## 2017-08-08 MED ORDER — AZITHROMYCIN 250 MG PO TABS
1000.0000 mg | ORAL_TABLET | Freq: Once | ORAL | Status: AC
  Administered 2017-08-08: 1000 mg via ORAL
  Filled 2017-08-08: qty 4

## 2017-08-08 MED ORDER — CEFTRIAXONE SODIUM 250 MG IJ SOLR
250.0000 mg | Freq: Once | INTRAMUSCULAR | Status: AC
  Administered 2017-08-08: 250 mg via INTRAMUSCULAR
  Filled 2017-08-08: qty 250

## 2017-08-08 MED ORDER — LIDOCAINE HCL (PF) 1 % IJ SOLN
INTRAMUSCULAR | Status: AC
  Filled 2017-08-08: qty 5

## 2017-08-08 MED ORDER — NITROFURANTOIN MONOHYD MACRO 100 MG PO CAPS: 100.0000 mg | ORAL_CAPSULE | Freq: Two times a day (BID) | ORAL | 0 refills | Status: DC

## 2017-08-08 NOTE — ED Provider Notes (Signed)
[facility] Provider Note   CSN: [id number] Arrival date & time: 08/08/17  1207     History   Chief Complaint No chief complaint on file.   HPI Shelly Tate is a 26 y.o. female.  HPI 26 year old African-American female with no pertinent past medical history presents the ED for treatment of STD.  Patient states that her boyfriend told her that he was exposed to trichomonas and chlamydia and that she needs to be treated.  Patient reports some mild dysuria for 2 days but denies any other associated symptoms including vaginal bleeding, vaginal discharge, abdominal pain, pelvic pain, nausea, emesis, fevers, change in bowel habits.  Patient has had STDs in the past that she is been treated for.  Requesting HIV and syphilis testing as well.  Denies any associated hematuria, frequency, urgency. Past Medical History:  Diagnosis Date  . Chlamydia   . Medical history non-contributory   . Trichomonas infection     Patient Active Problem List   Diagnosis Date Noted  . GDM (gestational diabetes mellitus) 03/01/2017  . Chlamydia infection affecting pregnancy in second trimester 01/01/2017  . Trichimoniasis 01/01/2017  . Supervision of high risk pregnancy, antepartum 09/30/2016    Past Surgical History:  Procedure Laterality Date  . NO PAST SURGERIES      OB History    Gravida Para Term Preterm AB Living   3 3 3     3    SAB TAB Ectopic Multiple Live Births         0 3       Home Medications    Prior to Admission medications   Not on File    Family History Family History  Problem Relation Age of Onset  . Diabetes Mother   . Stroke Paternal Grandmother   . Alcohol abuse Neg Hx   . Arthritis Neg Hx   . Asthma Neg Hx   . Birth defects Neg Hx   . Cancer Neg Hx   . COPD Neg Hx   . Depression Neg Hx   . Drug abuse Neg Hx   . Early death Neg Hx   . Hearing loss Neg Hx   . Heart disease Neg Hx   . Hyperlipidemia Neg Hx   .  Hypertension Neg Hx   . Kidney disease Neg Hx   . Learning disabilities Neg Hx   . Mental illness Neg Hx   . Mental retardation Neg Hx   . Miscarriages / Stillbirths Neg Hx   . Vision loss Neg Hx     Social History Social History  Substance Use Topics  . Smoking status: Former Smoker    Packs/day: 0.25    Types: Cigarettes    Quit date: 11/18/2016  . Smokeless tobacco: Never Used  . Alcohol use No     Allergies   Patient has no known allergies.   Review of Systems Review of Systems  Constitutional: Negative for chills and fever.  HENT: Negative for congestion.   Eyes: Negative for visual disturbance.  Respiratory: Negative for cough and shortness of breath.   Cardiovascular: Negative for chest pain.  Gastrointestinal: Negative for abdominal pain, diarrhea, nausea and vomiting.  Genitourinary: Positive for dysuria. Negative for flank pain, frequency, hematuria, urgency, vaginal bleeding and vaginal discharge.  Musculoskeletal: Negative for arthralgias and myalgias.  Skin: Negative for rash.  Neurological: Negative for dizziness, syncope, weakness, light-headedness, numbness and headaches.  Psychiatric/Behavioral: Negative for sleep disturbance. The patient is not nervous/anxious.  Physical Exam Updated Vital Signs BP 121/76   Pulse 86   Temp 98.2 F (36.8 C) (Oral)   Resp 18   SpO2 98%   Physical Exam  Constitutional: She is oriented to person, place, and time. She appears well-developed and well-nourished.  Non-toxic appearance. No distress.  HENT:  Head: Normocephalic and atraumatic.  Nose: Nose normal.  Mouth/Throat: Oropharynx is clear and moist.  Eyes: Pupils are equal, round, and reactive to light. Conjunctivae are normal. Right eye exhibits no discharge. Left eye exhibits no discharge.  Neck: Normal range of motion. Neck supple.  Cardiovascular: Normal rate, regular rhythm, normal heart sounds and intact distal pulses.   Pulmonary/Chest: Effort  normal and breath sounds normal. No respiratory distress. She exhibits no tenderness.  Abdominal: Soft. Bowel sounds are normal. There is no tenderness. There is no rigidity, no rebound, no guarding, no CVA tenderness, no tenderness at McBurney's point and negative Murphy's sign.  Musculoskeletal: Normal range of motion. She exhibits no tenderness.  Lymphadenopathy:    She has no cervical adenopathy.  Neurological: She is alert and oriented to person, place, and time.  Skin: Skin is warm and dry. Capillary refill takes less than 2 seconds.  Psychiatric: Her behavior is normal. Judgment and thought content normal.  Nursing note and vitals reviewed.    ED Treatments / Results  Labs (all labs ordered are listed, but only abnormal results are displayed) Labs Reviewed  URINALYSIS, ROUTINE W REFLEX MICROSCOPIC - Abnormal; Notable for the following:       Result Value   APPearance HAZY (*)    Ketones, ur 5 (*)    Protein, ur 30 (*)    Leukocytes, UA MODERATE (*)    Bacteria, UA RARE (*)    Squamous Epithelial / LPF 6-30 (*)    All other components within normal limits  URINE CULTURE  HIV ANTIBODY (ROUTINE TESTING)  RAPID HIV SCREEN (HIV 1/2 AB+AG)  POC URINE PREG, ED  GC/CHLAMYDIA PROBE AMP ([place]) NOT AT [facility]    EKG  EKG Interpretation None       Radiology No results found.  Procedures Procedures (including critical care time)  Medications Ordered in ED Medications  lidocaine (PF) (XYLOCAINE) 1 % injection (not administered)  cefTRIAXone (ROCEPHIN) injection 250 mg (250 mg Intramuscular Given 08/08/17 1335)  azithromycin (ZITHROMAX) tablet 1,000 mg (1,000 mg Oral Given 08/08/17 1334)  metroNIDAZOLE (FLAGYL) tablet 2,000 mg (2,000 mg Oral Given 08/08/17 1334)     Initial Impression / Assessment and Plan / ED Course  I have reviewed the triage vital signs and the nursing notes.  Pertinent labs & imaging results that were available during my care of the patient  were reviewed by me and considered in my medical decision making (see chart for details).     Patient presents to the ED for exposure to trichomonas and chlamydia.  Patient reports some mild dysuria but denies any other urinary symptoms or vaginal symptoms.  Denies any associated abdominal pain or pelvic pain.  Offered pelvic exam the patient would like to avoid at this time.  Patient is overall well-appearing and nontoxic on exam.  Abdominal exam is benign without any focal tenderness.  UA likely signs of infection with WBCs, leukocytes, rare bacteria also significant amount of squamous epithelium.  No CVA tenderness on exam.  Again patient denied pelvic exam due to not having any pelvic or vaginal complaints.  Will add on GC chlamydia to urine.  Will treat with Rocephin, azithromycin,  Flagyl.  Given signs of urinary tract infection will treat with Macrobid for 5 days low suspicion for pyelonephritis. Low suspicion for pid.   Pt is hemodynamically stable, in NAD, & able to ambulate in the ED. Evaluation does not show pathology that would require ongoing emergent intervention or inpatient treatment. I explained the diagnosis to the patient. Pain has been managed & has no complaints prior to dc. Pt is comfortable with above plan and is stable for discharge at this time. All questions were answered prior to disposition. Strict return precautions for f/u to the ED were discussed. Encouraged follow up with PCP.   Final Clinical Impressions(s) / ED Diagnoses   Final diagnoses:  Acute cystitis without hematuria  STD exposure    New Prescriptions New Prescriptions   No medications on file     Wallace Keller 08/08/17 1358    Alvira Monday, MD 08/12/17 1141

## 2017-08-08 NOTE — Discharge Instructions (Signed)
You have been treated with antibiotics for gonorrhea, chlamydia, trichomonas.  Your cultures for gonorrhea and chlamydia are pending.  Your urine does show signs of infection and he will be treated with Macrobid.  If you develop any new symptoms return to the ED.  Avoid sexual contact for 14 days.  Use safe sex practices.  Inform all sexual partners that you were treated for an STD.

## 2017-08-08 NOTE — ED Triage Notes (Signed)
Patient complains of being exposed to STD, also complains of dysuria x 2 days

## 2017-08-09 LAB — URINE CULTURE

## 2017-10-12 NOTE — L&D Delivery Note (Signed)
Delivery Note At 2:20 PM a viable female was delivered via Vaginal, Spontaneous (Presentation: OA ;  ).  APGAR: 9, 9; weight 3205 grams .   Placenta status: Intact with Para March presentation.  Cord: 3 VC with the following complications: none.  Cord pH: NA  Anesthesia:  Epidural Episiotomy: None Lacerations: None Suture Repair: None Est. Blood Loss (mL): 50  Mom to postpartum.  Baby to Couplet care / Skin to Skin.  Shelly Tate 07/28/2018, 3:00 PM  Please schedule this patient for Postpartum visit in: 6 weeks with the following provider: Any provider For C/S patients schedule nurse incision check in weeks 2 weeks: no Low risk pregnancy complicated by: nothing Delivery mode:  SVD Anticipated Birth Control:  Plans Interval BTL PP Procedures needed: nothing  Schedule Integrated BH visit: no

## 2018-03-14 ENCOUNTER — Ambulatory Visit: Payer: Medicaid Other | Admitting: Obstetrics

## 2018-05-09 ENCOUNTER — Encounter (HOSPITAL_COMMUNITY): Payer: Self-pay | Admitting: *Deleted

## 2018-05-09 ENCOUNTER — Inpatient Hospital Stay (HOSPITAL_COMMUNITY)
Admission: AD | Admit: 2018-05-09 | Discharge: 2018-05-09 | Disposition: A | Discharge status: Home / Self Care | Payer: Medicaid Other | Source: Ambulatory Visit | Attending: Family Medicine | Admitting: Family Medicine

## 2018-05-09 DIAGNOSIS — O2341 Unspecified infection of urinary tract in pregnancy, first trimester: Secondary | ICD-10-CM

## 2018-05-09 DIAGNOSIS — O26891 Other specified pregnancy related conditions, first trimester: Secondary | ICD-10-CM | POA: Diagnosis not present

## 2018-05-09 DIAGNOSIS — R109 Unspecified abdominal pain: Secondary | ICD-10-CM | POA: Insufficient documentation

## 2018-05-09 DIAGNOSIS — Z3480 Encounter for supervision of other normal pregnancy, unspecified trimester: Secondary | ICD-10-CM

## 2018-05-09 DIAGNOSIS — Z3A10 10 weeks gestation of pregnancy: Secondary | ICD-10-CM | POA: Diagnosis not present

## 2018-05-09 DIAGNOSIS — O0930 Supervision of pregnancy with insufficient antenatal care, unspecified trimester: Secondary | ICD-10-CM

## 2018-05-09 DIAGNOSIS — Z8632 Personal history of gestational diabetes: Secondary | ICD-10-CM

## 2018-05-09 DIAGNOSIS — A609 Anogenital herpesviral infection, unspecified: Secondary | ICD-10-CM

## 2018-05-09 DIAGNOSIS — O2340 Unspecified infection of urinary tract in pregnancy, unspecified trimester: Secondary | ICD-10-CM

## 2018-05-09 DIAGNOSIS — O09299 Supervision of pregnancy with other poor reproductive or obstetric history, unspecified trimester: Secondary | ICD-10-CM

## 2018-05-09 HISTORY — DX: Gestational diabetes mellitus in pregnancy, unspecified control: O24.419

## 2018-05-09 LAB — CBC
HCT: 34.8 % — ABNORMAL LOW (ref 36.0–46.0)
Hemoglobin: 11.7 g/dL — ABNORMAL LOW (ref 12.0–15.0)
MCH: 27.1 pg (ref 26.0–34.0)
MCHC: 33.6 g/dL (ref 30.0–36.0)
MCV: 80.6 fL (ref 78.0–100.0)
PLATELETS: 218 10*3/uL (ref 150–400)
RBC: 4.32 MIL/uL (ref 3.87–5.11)
RDW: 13.9 % (ref 11.5–15.5)
WBC: 9.5 10*3/uL (ref 4.0–10.5)

## 2018-05-09 LAB — URINALYSIS, ROUTINE W REFLEX MICROSCOPIC
Bilirubin Urine: NEGATIVE
GLUCOSE, UA: NEGATIVE mg/dL
HGB URINE DIPSTICK: NEGATIVE
Ketones, ur: NEGATIVE mg/dL
NITRITE: NEGATIVE
PH: 6 (ref 5.0–8.0)
Protein, ur: NEGATIVE mg/dL
SPECIFIC GRAVITY, URINE: 1.023 (ref 1.005–1.030)

## 2018-05-09 LAB — DIFFERENTIAL
Basophils Absolute: 0 10*3/uL (ref 0.0–0.1)
Basophils Relative: 0 %
Eosinophils Absolute: 0.2 10*3/uL (ref 0.0–0.7)
Eosinophils Relative: 3 %
Lymphocytes Relative: 21 %
Lymphs Abs: 2 10*3/uL (ref 0.7–4.0)
Monocytes Absolute: 0.3 10*3/uL (ref 0.1–1.0)
Monocytes Relative: 3 %
Neutro Abs: 6.9 10*3/uL (ref 1.7–7.7)
Neutrophils Relative %: 73 %

## 2018-05-09 LAB — WET PREP, GENITAL
CLUE CELLS WET PREP: NONE SEEN
Sperm: NONE SEEN
Trich, Wet Prep: NONE SEEN
Yeast Wet Prep HPF POC: NONE SEEN

## 2018-05-09 LAB — TYPE AND SCREEN
ABO/RH(D): B POS
Antibody Screen: NEGATIVE

## 2018-05-09 LAB — HEMOGLOBIN A1C
HEMOGLOBIN A1C: 5.2 % (ref 4.8–5.6)
Mean Plasma Glucose: 102.54 mg/dL

## 2018-05-09 LAB — HEPATITIS B SURFACE ANTIGEN: HEP B S AG: NEGATIVE

## 2018-05-09 LAB — POCT PREGNANCY, URINE: Preg Test, Ur: POSITIVE — AB

## 2018-05-09 MED ORDER — PRENATAL COMPLETE 14-0.4 MG PO TABS: 1.0000 | ORAL_TABLET | Freq: Every day | ORAL | 4 refills | Status: DC

## 2018-05-09 MED ORDER — CEPHALEXIN 500 MG PO CAPS: 500.0000 mg | ORAL_CAPSULE | Freq: Four times a day (QID) | ORAL | 0 refills | Status: AC

## 2018-05-09 NOTE — MAU Provider Note (Addendum)
History     CSN: [id number]  Arrival date and time: 05/09/18 1418   First Provider Initiated Contact with Patient 05/09/18 1627      Chief Complaint  Patient presents with  . Abdominal Pain   HPI  Shelly Tate is a 27 y.o. [phone] with pregnancy of unknown gestational age who presents to MAU with chief complaint of pelvic pressure. Patient states she had a positive home pregnancy test two weeks ago but has not had a regular menstrual cycle since delivering her daughter in July 2018. Denies vaginal bleeding, leaking of fluid, fever, falls, or recent illness.  Patient states she is unaware of fetal movement.  Denies contraception, previously planned to get depo but had issues with child care.  Patient states she does not have any child care today and cannot stay for an ultrasound.  OB History    Gravida  4   Para  3   Term  3   Preterm      AB      Living  3     SAB      TAB      Ectopic      Multiple  0   Live Births  3           Past Medical History:  Diagnosis Date  . Chlamydia   . Gestational diabetes   . Trichomonas infection     Past Surgical History:  Procedure Laterality Date  . NO PAST SURGERIES      Family History  Problem Relation Age of Onset  . Diabetes Mother   . Stroke Paternal Grandmother   . Alcohol abuse Neg Hx   . Arthritis Neg Hx   . Asthma Neg Hx   . Birth defects Neg Hx   . Cancer Neg Hx   . COPD Neg Hx   . Depression Neg Hx   . Drug abuse Neg Hx   . Early death Neg Hx   . Hearing loss Neg Hx   . Heart disease Neg Hx   . Hyperlipidemia Neg Hx   . Hypertension Neg Hx   . Kidney disease Neg Hx   . Learning disabilities Neg Hx   . Mental illness Neg Hx   . Mental retardation Neg Hx   . Miscarriages / Stillbirths Neg Hx   . Vision loss Neg Hx     Social History   Tobacco Use  . Smoking status: Former Smoker    Packs/day: 0.25    Types: Cigarettes    Last attempt to quit: 11/18/2016    Years since  quitting: 1.4  . Smokeless tobacco: Never Used  Substance Use Topics  . Alcohol use: No  . Drug use: No    Allergies: No Known Allergies  Medications Prior to Admission  Medication Sig Dispense Refill Last Dose  . nitrofurantoin, macrocrystal-monohydrate, (MACROBID) 100 MG capsule Take 1 capsule (100 mg total) by mouth 2 (two) times daily. 10 capsule 0     Review of Systems  Constitutional: Negative for chills, fatigue and fever.  Gastrointestinal: Negative for nausea and vomiting.  Genitourinary: Positive for vaginal pain. Negative for vaginal bleeding and vaginal discharge.  Neurological: Negative for headaches.  All other systems reviewed and are negative.  Physical Exam   Blood pressure 122/61, pulse (!) 120, temperature 98 F (36.7 C), temperature source Oral, resp. rate 18, height 5\' 2"  (1.575 m), weight 157 lb 1.9 oz (71.3 kg), last menstrual period 02/28/2018, unknown if currently breastfeeding.  Physical Exam  Nursing note and vitals reviewed. Constitutional: She is oriented to person, place, and time. She appears well-developed and well-nourished.  Cardiovascular: Normal rate, regular rhythm, normal heart sounds and intact distal pulses.  Respiratory: Effort normal and breath sounds normal.  GI:  Gravid Fundal height 27cm  Genitourinary: Vagina normal and uterus normal.  Musculoskeletal: Normal range of motion.  Neurological: She is alert and oriented to person, place, and time. She has normal reflexes.  Skin: Skin is warm and dry.  Psychiatric: She has a normal mood and affect. Her behavior is normal. Judgment and thought content normal.    MAU Course  Procedures  MDM Reactive NST: discontinued at minute 17 due to patient request (activity of patient's [age] year old and lack of other adult to monitor children in MAU) Baseline 145, moderate variability, positive accelerations, no decelerations Toco: quiet  Patient Vitals for the past 24 hrs:  BP Temp Temp  src Pulse Resp Height Weight  05/09/18 1735 115/64 - - (!) 102 - - -  05/09/18 1427 122/61 98 F (36.7 C) Oral (!) 120 18 5\' 2"  (1.575 m) 157 lb 1.9 oz (71.3 kg)    Orders Placed This Encounter  Procedures  . Wet prep, genital  . Culture, OB Urine  . US OB DETAIL + 14 WK  . Urinalysis, Routine w reflex microscopic  . Hepatitis B surface antigen  . Rubella screen  . RPR  . CBC  . Differential  . HIV antibody (routine testing)  . Hemoglobin A1c  . Pregnancy, urine POC  . Type and screen [facility]   Results for orders placed or performed during the hospital encounter of 05/09/18 (from the past 24 hour(s))  Urinalysis, Routine w reflex microscopic     Status: Abnormal   Collection Time: 05/09/18  2:36 PM  Result Value Ref Range   Color, Urine YELLOW YELLOW   APPearance CLOUDY (A) CLEAR   Specific Gravity, Urine 1.023 1.005 - 1.030   pH 6.0 5.0 - 8.0   Glucose, UA NEGATIVE NEGATIVE mg/dL   Hgb urine dipstick NEGATIVE NEGATIVE   Bilirubin Urine NEGATIVE NEGATIVE   Ketones, ur NEGATIVE NEGATIVE mg/dL   Protein, ur NEGATIVE NEGATIVE mg/dL   Nitrite NEGATIVE NEGATIVE   Leukocytes, UA LARGE (A) NEGATIVE   RBC / HPF 0-5 0 - 5 RBC/hpf   WBC, UA 6-10 0 - 5 WBC/hpf   Bacteria, UA RARE (A) NONE SEEN   Squamous Epithelial / LPF 21-50 0 - 5   Mucus PRESENT   Pregnancy, urine POC     Status: Abnormal   Collection Time: 05/09/18  2:40 PM  Result Value Ref Range   Preg Test, Ur POSITIVE (A) NEGATIVE  Wet prep, genital     Status: Abnormal   Collection Time: 05/09/18  4:43 PM  Result Value Ref Range   Yeast Wet Prep HPF POC NONE SEEN NONE SEEN   Trich, Wet Prep NONE SEEN NONE SEEN   Clue Cells Wet Prep HPF POC NONE SEEN NONE SEEN   WBC, Wet Prep HPF POC FEW (A) NONE SEEN   Sperm NONE SEEN   CBC     Status: Abnormal   Collection Time: 05/09/18  5:04 PM  Result Value Ref Range   WBC 9.5 4.0 - 10.5 K/uL   RBC 4.32 3.87 - 5.11 MIL/uL   Hemoglobin 11.7 (L) 12.0 -  15.0 g/dL   HCT 16.1 (L) 09.6 - 04.5 %   MCV 80.6  78.0 - 100.0 fL   MCH 27.1 26.0 - 34.0 pg   MCHC 33.6 30.0 - 36.0 g/dL   RDW 16.113.9 09.611.5 - 04.515.5 %   Platelets 218 150 - 400 K/uL  Differential     Status: None   Collection Time: 05/09/18  5:04 PM  Result Value Ref Range   Neutrophils Relative % 73 %   Neutro Abs 6.9 1.7 - 7.7 K/uL   Lymphocytes Relative 21 %   Lymphs Abs 2.0 0.7 - 4.0 K/uL   Monocytes Relative 3 %   Monocytes Absolute 0.3 0.1 - 1.0 K/uL   Eosinophils Relative 3 %   Eosinophils Absolute 0.2 0.0 - 0.7 K/uL   Basophils Relative 0 %   Basophils Absolute 0.0 0.0 - 0.1 K/uL  Type and screen [facility]     Status: None (Preliminary result)   Collection Time: 05/09/18  5:04 PM  Result Value Ref Range   ABO/RH(D) B POS    Antibody Screen PENDING    Sample Expiration      05/12/2018 Performed at [facility], [address] [place], [place] [address]    Meds ordered this encounter  Medications  . cephALEXin (KEFLEX) 500 MG capsule    Sig: Take 1 capsule (500 mg total) by mouth 4 (four) times daily for 10 days.    Dispense:  40 capsule    Refill:  0    Order Specific Question:   Supervising Provider    Answer:   Reva BoresPRATT, TANYA S [[id number]]  . Prenatal Vit-Fe Fumarate-FA (PRENATAL COMPLETE) 14-0.4 MG TABS    Sig: Take 1 tablet by mouth at bedtime.    Dispense:  60 each    Refill:  4    Order Specific Question:   Supervising Provider    Answer:   Reva BoresPRATT, TANYA S [[id number]]   Assessment and Plan  --27 y.o. [phone] at [id number]  --Pregnancy of unknown dating, patient cannot accommodate inpatient ultrasound, outpatient ordered --Reactive NST --UTI in pregnancy, urine culture ordered, rx for Keflex given to patient as described above --New OB labs drawn in MAU --Reviewed general obstetric precautions including but not limited to falls, fever, vaginal bleeding, leaking of fluid, headache not relieved by Tylenol, rest and PO hydration.  --Discharge home in  stable condition  --[facility] 05/27/2018 [facility]  Samantha C Weinhold, CNM 05/09/2018, 5:39 PM

## 2018-05-09 NOTE — MAU Note (Signed)
Pt C/O lower abd pressure since last week, states she is feeling movement, hasn't had a period in 3 months.  Did HPT 2 days ago, but states the lines were faded & she couldn't tell the result.  Thinks she may have UTI, urine has a strong odor.

## 2018-05-09 NOTE — Discharge Instructions (Signed)
Second Trimester of Pregnancy The second trimester is from week 13 through week 28, month 4 through 6. This is often the time in pregnancy that you feel your best. Often times, morning sickness has lessened or quit. You may have more energy, and you may get hungry more often. Your unborn baby (fetus) is growing rapidly. At the end of the sixth month, he or she is about 9 inches long and weighs about 1 pounds. You will likely feel the baby move (quickening) between 18 and 20 weeks of pregnancy. Follow these instructions at home:  Avoid all smoking, herbs, and alcohol. Avoid drugs not approved by your doctor.  Do not use any tobacco products, including cigarettes, chewing tobacco, and electronic cigarettes. If you need help quitting, ask your doctor. You may get counseling or other support to help you quit.  Only take medicine as told by your doctor. Some medicines are safe and some are not during pregnancy.  Exercise only as told by your doctor. Stop exercising if you start having cramps.  Eat regular, healthy meals.  Wear a good support bra if your breasts are tender.  Do not use hot tubs, steam rooms, or saunas.  Wear your seat belt when driving.  Avoid raw meat, uncooked cheese, and liter boxes and soil used by cats.  Take your prenatal vitamins.  Take 1500-2000 milligrams of calcium daily starting at the 20th week of pregnancy until you deliver your baby.  Try taking medicine that helps you poop (stool softener) as needed, and if your doctor approves. Eat more fiber by eating fresh fruit, vegetables, and whole grains. Drink enough fluids to keep your pee (urine) clear or pale yellow.  Take warm water baths (sitz baths) to soothe pain or discomfort caused by hemorrhoids. Use hemorrhoid cream if your doctor approves.  If you have puffy, bulging veins (varicose veins), wear support hose. Raise (elevate) your feet for 15 minutes, 3-4 times a day. Limit salt in your diet.  Avoid heavy  lifting, wear low heals, and sit up straight.  Rest with your legs raised if you have leg cramps or low back pain.  Visit your dentist if you have not gone during your pregnancy. Use a soft toothbrush to brush your teeth. Be gentle when you floss.  You can have sex (intercourse) unless your doctor tells you not to.  Go to your doctor visits. Get help if:  You feel dizzy.  You have mild cramps or pressure in your lower belly (abdomen).  You have a nagging pain in your belly area.  You continue to feel sick to your stomach (nauseous), throw up (vomit), or have watery poop (diarrhea).  You have bad smelling fluid coming from your vagina.  You have pain with peeing (urination). Get help right away if:  You have a fever.  You are leaking fluid from your vagina.  You have spotting or bleeding from your vagina.  You have severe belly cramping or pain.  You lose or gain weight rapidly.  You have trouble catching your breath and have chest pain.  You notice sudden or extreme puffiness (swelling) of your face, hands, ankles, feet, or legs.  You have not felt the baby move in over an hour.  You have severe headaches that do not go away with medicine.  You have vision changes. This information is not intended to replace advice given to you by your health care provider. Make sure you discuss any questions you have with your health care   provider. Document Released: 12/23/2009 Document Revised: 03/05/2016 Document Reviewed: 11/29/2012 Elsevier Interactive Patient Education  2017 [employer]  

## 2018-05-10 LAB — HIV ANTIBODY (ROUTINE TESTING W REFLEX): HIV Screen 4th Generation wRfx: NONREACTIVE

## 2018-05-10 LAB — CULTURE, OB URINE

## 2018-05-10 LAB — RPR: RPR Ser Ql: NONREACTIVE

## 2018-05-10 LAB — RUBELLA SCREEN: Rubella: 2.19 index (ref >0.99)

## 2018-05-11 LAB — GC/CHLAMYDIA PROBE AMP (~~LOC~~) NOT AT ARMC
Chlamydia: NEGATIVE
Neisseria Gonorrhea: NEGATIVE

## 2018-05-24 ENCOUNTER — Inpatient Hospital Stay (HOSPITAL_COMMUNITY)
Admission: AD | Admit: 2018-05-24 | Discharge: 2018-05-24 | Disposition: A | Discharge status: Home / Self Care | Payer: Medicaid Other | Source: Ambulatory Visit | Attending: Obstetrics and Gynecology | Admitting: Obstetrics and Gynecology

## 2018-05-24 ENCOUNTER — Encounter (HOSPITAL_COMMUNITY): Payer: Self-pay

## 2018-05-24 ENCOUNTER — Other Ambulatory Visit: Payer: Self-pay

## 2018-05-24 DIAGNOSIS — O0933 Supervision of pregnancy with insufficient antenatal care, third trimester: Secondary | ICD-10-CM | POA: Diagnosis not present

## 2018-05-24 DIAGNOSIS — O26893 Other specified pregnancy related conditions, third trimester: Secondary | ICD-10-CM | POA: Insufficient documentation

## 2018-05-24 DIAGNOSIS — R1030 Lower abdominal pain, unspecified: Secondary | ICD-10-CM | POA: Diagnosis present

## 2018-05-24 DIAGNOSIS — Z87891 Personal history of nicotine dependence: Secondary | ICD-10-CM | POA: Diagnosis not present

## 2018-05-24 DIAGNOSIS — R030 Elevated blood-pressure reading, without diagnosis of hypertension: Secondary | ICD-10-CM | POA: Diagnosis not present

## 2018-05-24 DIAGNOSIS — Z0371 Encounter for suspected problem with amniotic cavity and membrane ruled out: Secondary | ICD-10-CM

## 2018-05-24 DIAGNOSIS — R109 Unspecified abdominal pain: Secondary | ICD-10-CM | POA: Diagnosis not present

## 2018-05-24 DIAGNOSIS — Z3A29 29 weeks gestation of pregnancy: Secondary | ICD-10-CM | POA: Insufficient documentation

## 2018-05-24 DIAGNOSIS — O0930 Supervision of pregnancy with insufficient antenatal care, unspecified trimester: Secondary | ICD-10-CM

## 2018-05-24 LAB — URINALYSIS, ROUTINE W REFLEX MICROSCOPIC
Bilirubin Urine: NEGATIVE
GLUCOSE, UA: NEGATIVE mg/dL
Hgb urine dipstick: NEGATIVE
Ketones, ur: NEGATIVE mg/dL
Nitrite: NEGATIVE
PH: 5 (ref 5.0–8.0)
Protein, ur: 30 mg/dL — AB
Specific Gravity, Urine: 1.019 (ref 1.005–1.030)

## 2018-05-24 LAB — CBC
HEMATOCRIT: 37.2 % (ref 36.0–46.0)
HEMOGLOBIN: 12.5 g/dL (ref 12.0–15.0)
MCH: 27.2 pg (ref 26.0–34.0)
MCHC: 33.6 g/dL (ref 30.0–36.0)
MCV: 80.9 fL (ref 78.0–100.0)
Platelets: 234 10*3/uL (ref 150–400)
RBC: 4.6 MIL/uL (ref 3.87–5.11)
RDW: 14 % (ref 11.5–15.5)
WBC: 10.4 10*3/uL (ref 4.0–10.5)

## 2018-05-24 LAB — COMPREHENSIVE METABOLIC PANEL
ALK PHOS: 95 U/L (ref 38–126)
ALT: 13 U/L (ref 0–44)
ANION GAP: 10 (ref 5–15)
AST: 18 U/L (ref 15–41)
Albumin: 3.1 g/dL — ABNORMAL LOW (ref 3.5–5.0)
BILIRUBIN TOTAL: 0.3 mg/dL (ref 0.3–1.2)
BUN: 10 mg/dL (ref 6–20)
CALCIUM: 9.3 mg/dL (ref 8.9–10.3)
CO2: 24 mmol/L (ref 22–32)
Chloride: 101 mmol/L (ref 98–111)
Creatinine, Ser: 0.81 mg/dL (ref 0.44–1.00)
GFR calc non Af Amer: >60 mL/min (ref >60)
Glucose, Bld: 91 mg/dL (ref 70–99)
Potassium: 4.5 mmol/L (ref 3.5–5.1)
Sodium: 135 mmol/L (ref 135–145)
TOTAL PROTEIN: 6.8 g/dL (ref 6.5–8.1)

## 2018-05-24 LAB — AMNISURE RUPTURE OF MEMBRANE (ROM) NOT AT ARMC: Amnisure ROM: NEGATIVE

## 2018-05-24 LAB — PROTEIN / CREATININE RATIO, URINE
Creatinine, Urine: 224 mg/dL
PROTEIN CREATININE RATIO: 0.22 mg/mg{creat} — AB (ref 0.00–0.15)
TOTAL PROTEIN, URINE: 50 mg/dL

## 2018-05-24 LAB — POCT FERN TEST: POCT Fern Test: NEGATIVE

## 2018-05-24 NOTE — MAU Note (Signed)
Is cramping and leaking.  Just started leaking about 2 hrs ago. Started cramping last night. No bleeding.

## 2018-05-24 NOTE — Discharge Instructions (Signed)
Hypertension During Pregnancy °Hypertension, commonly called high blood pressure, is when the force of blood pumping through your arteries is too strong. Arteries are blood vessels that carry blood from the heart throughout the body. Hypertension during pregnancy can cause problems for you and your baby. Your baby may be born early (prematurely) or may not weigh as much as he or she should at birth. Very bad cases of hypertension during pregnancy can be life-threatening. °Different types of hypertension can occur during pregnancy. These include: °· Chronic hypertension. This happens when: °? You have hypertension before pregnancy and it continues during pregnancy. °? You develop hypertension before you are [redacted] weeks pregnant, and it continues during pregnancy. °· Gestational hypertension. This is hypertension that develops after the 20th week of pregnancy. °· Preeclampsia, also called toxemia of pregnancy. This is a very serious type of hypertension that develops only during pregnancy. It affects the whole body, and it can be very dangerous for you and your baby. ° °Gestational hypertension and preeclampsia usually go away within 6 weeks after your baby is born. Women who have hypertension during pregnancy have a greater chance of developing hypertension later in life or during future pregnancies. °What are the causes? °The exact cause of hypertension is not known. °What increases the risk? °There are certain factors that make it more likely for you to develop hypertension during pregnancy. These include: °· Having hypertension during a previous pregnancy or prior to pregnancy. °· Being overweight. °· Being older than age 40. °· Being pregnant for the first time or being pregnant with more than one baby. °· Becoming pregnant using fertilization methods such as IVF (in vitro fertilization). °· Having diabetes, kidney problems, or systemic lupus erythematosus. °· Having a family history of hypertension. ° °What are the  signs or symptoms? °Chronic hypertension and gestational hypertension rarely cause symptoms. Preeclampsia causes symptoms, which may include: °· Increased protein in your urine. Your health care provider will check for this at every visit before you give birth (prenatal visit). °· Severe headaches. °· Sudden weight gain. °· Swelling of the hands, face, legs, and feet. °· Nausea and vomiting. °· Vision problems, such as blurred or double vision. °· Numbness in the face, arms, legs, and feet. °· Dizziness. °· Slurred speech. °· Sensitivity to bright lights. °· Abdominal pain. °· Convulsions. ° °How is this diagnosed? °You may be diagnosed with hypertension during a routine prenatal exam. At each prenatal visit, you may: °· Have a urine test to check for high amounts of protein in your urine. °· Have your blood pressure checked. A blood pressure reading is recorded as two numbers, such as "120 over 80" (or 120/80). The first ("top") number is called the systolic pressure. It is a measure of the pressure in your arteries when your heart beats. The second ("bottom") number is called the diastolic pressure. It is a measure of the pressure in your arteries as your heart relaxes between beats. Blood pressure is measured in a unit called mm Hg. A normal blood pressure reading is: °? Systolic: below 120. °? Diastolic: below 80. ° °The type of hypertension that you are diagnosed with depends on your test results and when your symptoms developed. °· Chronic hypertension is usually diagnosed before 20 weeks of pregnancy. °· Gestational hypertension is usually diagnosed after 20 weeks of pregnancy. °· Hypertension with high amounts of protein in the urine is diagnosed as preeclampsia. °· Blood pressure measurements that stay above 160 systolic, or above 110 diastolic, are   signs of severe preeclampsia. ° °How is this treated? °Treatment for hypertension during pregnancy varies depending on the type of hypertension you have and how  serious it is. °· If you take medicines called ACE inhibitors to treat chronic hypertension, you may need to switch medicines. ACE inhibitors should not be taken during pregnancy. °· If you have gestational hypertension, you may need to take blood pressure medicine. °· If you are at risk for preeclampsia, your health care provider may recommend that you take a low-dose aspirin every day to prevent high blood pressure during your pregnancy. °· If you have severe preeclampsia, you may need to be hospitalized so you and your baby can be monitored closely. You may also need to take medicine (magnesium sulfate) to prevent seizures and to lower blood pressure. This medicine may be given as an injection or through an IV tube. °· In some cases, if your condition gets worse, you may need to deliver your baby early. ° °Follow these instructions at home: °Eating and drinking °· Drink enough fluid to keep your urine clear or pale yellow. °· Eat a healthy diet that is low in salt (sodium). Do not add salt to your food. Check food labels to see how much sodium a food or beverage contains. °Lifestyle °· Do not use any products that contain nicotine or tobacco, such as cigarettes and e-cigarettes. If you need help quitting, ask your health care provider. °· Do not use alcohol. °· Avoid caffeine. °· Avoid stress as much as possible. Rest and get plenty of sleep. °General instructions °· Take over-the-counter and prescription medicines only as told by your health care provider. °· While lying down, lie on your left side. This keeps pressure off your baby. °· While sitting or lying down, raise (elevate) your feet. Try putting some pillows under your lower legs. °· Exercise regularly. Ask your health care provider what kinds of exercise are best for you. °· Keep all prenatal and follow-up visits as told by your health care provider. This is important. °Contact a health care provider if: °· You have symptoms that your health care  provider told you may require more treatment or monitoring, such as: °? Fever. °? Vomiting. °? Headache. °Get help right away if: °· You have severe abdominal pain or vomiting that does not get better with treatment. °· You suddenly develop swelling in your hands, ankles, or face. °· You gain 4 lbs (1.8 kg) or more in 1 week. °· You develop vaginal bleeding, or you have blood in your urine. °· You do not feel your baby moving as much as usual. °· You have blurred or double vision. °· You have muscle twitching or sudden tightening (spasms). °· You have shortness of breath. °· Your lips or fingernails turn blue. °This information is not intended to replace advice given to you by your health care provider. Make sure you discuss any questions you have with your health care provider. °Document Released: 06/16/2011 Document Revised: 04/17/2016 Document Reviewed: 03/13/2016 °Elsevier Interactive Patient Education © 2018 [employer] ° °

## 2018-05-24 NOTE — MAU Provider Note (Signed)
Chief Complaint:  Abdominal Pain and Rupture of Membranes   First Provider Initiated Contact with Patient 05/24/18 1616     HPI: Shelly Tate is a 27 y.o. G4P3003 at 5359w1d who presents to maternity admissions reporting abdominal pain and leaking of fluid. Reports continuous trickling of clear fluid since this morning. Also has had some intermittent lower abdominal pain. Denies recent intercourse, dysuria, or vaginal bleeding. Positive fetal movement.  Hx of HTN in previous pregnancy, no preeclampsia. Denies headache, visual disturbance, or epigastric pain. Denies CP, SOB, or palpitations.  Location: lower abdomen Quality: cramping Severity: 3/10 in pain scale Duration: today Timing: less than 5 times per hour Modifying factors: none Associated signs and symptoms: leaking fluid  Pregnancy Course:  No prenatal care during this pregnancy  Past Medical History:  Diagnosis Date  . Chlamydia   . Gestational diabetes   . Trichomonas infection    OB History  Gravida Para Term Preterm AB Living  4 3 3     3   SAB TAB Ectopic Multiple Live Births        0 3    # Outcome Date GA Lbr Len/2nd Weight Sex Delivery Anes PTL Lv  4 Current           3 Term 04/20/17 139w0d 10:20 / 00:09 2860 g F Vag-Spont EPI  LIV  2 Term 10/21/13 4530w2d -05:52 / 01:28 3260 g M Vag-Spont EPI  LIV  1 Term      Vag-Spont  N LIV   Past Surgical History:  Procedure Laterality Date  . NO PAST SURGERIES     Family History  Problem Relation Age of Onset  . Diabetes Mother   . Stroke Paternal Grandmother   . Alcohol abuse Neg Hx   . Arthritis Neg Hx   . Asthma Neg Hx   . Birth defects Neg Hx   . Cancer Neg Hx   . COPD Neg Hx   . Depression Neg Hx   . Drug abuse Neg Hx   . Early death Neg Hx   . Hearing loss Neg Hx   . Heart disease Neg Hx   . Hyperlipidemia Neg Hx   . Hypertension Neg Hx   . Kidney disease Neg Hx   . Learning disabilities Neg Hx   . Mental illness Neg Hx   . Mental retardation Neg  Hx   . Miscarriages / Stillbirths Neg Hx   . Vision loss Neg Hx    Social History   Tobacco Use  . Smoking status: Former Smoker    Packs/day: 0.25    Types: Cigarettes    Last attempt to quit: 11/18/2016    Years since quitting: 1.5  . Smokeless tobacco: Never Used  Substance Use Topics  . Alcohol use: No  . Drug use: No   No Known Allergies Medications Prior to Admission  Medication Sig Dispense Refill Last Dose  . Prenatal Vit-Fe Fumarate-FA (PRENATAL COMPLETE) 14-0.4 MG TABS Take 1 tablet by mouth at bedtime. 60 each 4     I have reviewed patient's Past Medical Hx, Surgical Hx, Family Hx, Social Hx, medications and allergies.   ROS:  Review of Systems  Constitutional: Negative.   Gastrointestinal: Positive for abdominal pain. Negative for constipation, diarrhea, nausea and vomiting.  Genitourinary: Positive for vaginal discharge. Negative for dysuria and vaginal bleeding.   Physical Exam   Patient Vitals for the past 24 hrs:  BP Temp Temp src Pulse Resp SpO2 Weight  05/24/18 1700 128/72 - - Marland Kitchen(!)  121 - 100 % -  05/24/18 1645 (!) 145/79 - - (!) 110 - 98 % -  05/24/18 1630 137/77 - - (!) 106 - 98 % -  05/24/18 1628 128/74 - - (!) 124 - - -  05/24/18 1622 - - - (!) 125 - - -  05/24/18 1537 (!) 141/82 98.4 F (36.9 C) Oral (!) 142 20 99 % 72.5 kg    Constitutional: Well-developed, well-nourished female in no acute distress.  Cardiovascular: tachycardia, regular rhythm. No murmur.  Respiratory: normal effort, lung sounds clear throughout GI: Abd soft, non-tender, gravid appropriate for gestational age. Pos BS x 4 MS: Extremities nontender, no edema, normal ROM Neurologic: Alert and oriented x 4.  GU:      Pelvic: NEFG, physiologic discharge, no blood, cervix clean. No pooling of fluid  Dilation: Closed Effacement (%): Thick Cervical Position: Posterior Exam by:: Judeth HornErin Conlee Sliter NP  NST:  Baseline: 150 bpm, Variability: Good {> 6 bpm), Accelerations: Reactive and  Decelerations: Absent   Labs: Results for orders placed or performed during the hospital encounter of 05/24/18 (from the past 24 hour(s))  Urinalysis, Routine w reflex microscopic     Status: Abnormal   Collection Time: 05/24/18  3:40 PM  Result Value Ref Range   Color, Urine YELLOW YELLOW   APPearance CLOUDY (A) CLEAR   Specific Gravity, Urine 1.019 1.005 - 1.030   pH 5.0 5.0 - 8.0   Glucose, UA NEGATIVE NEGATIVE mg/dL   Hgb urine dipstick NEGATIVE NEGATIVE   Bilirubin Urine NEGATIVE NEGATIVE   Ketones, ur NEGATIVE NEGATIVE mg/dL   Protein, ur 30 (A) NEGATIVE mg/dL   Nitrite NEGATIVE NEGATIVE   Leukocytes, UA LARGE (A) NEGATIVE   RBC / HPF 0-5 0 - 5 RBC/hpf   WBC, UA 11-20 0 - 5 WBC/hpf   Bacteria, UA FEW (A) NONE SEEN   Squamous Epithelial / LPF 21-50 0 - 5   Mucus PRESENT   Protein / creatinine ratio, urine     Status: Abnormal   Collection Time: 05/24/18  3:43 PM  Result Value Ref Range   Creatinine, Urine 224.00 mg/dL   Total Protein, Urine 50 mg/dL   Protein Creatinine Ratio 0.22 (H) 0.00 - 0.15 mg/mg[Cre]  [facility]     Status: None   Collection Time: 05/24/18  4:43 PM  Result Value Ref Range   Amnisure ROM NEGATIVE   CBC     Status: None   Collection Time: 05/24/18  4:50 PM  Result Value Ref Range   WBC 10.4 4.0 - 10.5 K/uL   RBC 4.60 3.87 - 5.11 MIL/uL   Hemoglobin 12.5 12.0 - 15.0 g/dL   HCT 21.337.2 08.636.0 - 57.846.0 %   MCV 80.9 78.0 - 100.0 fL   MCH 27.2 26.0 - 34.0 pg   MCHC 33.6 30.0 - 36.0 g/dL   RDW 46.914.0 62.911.5 - 52.815.5 %   Platelets 234 150 - 400 K/uL  Comprehensive metabolic panel     Status: Abnormal   Collection Time: 05/24/18  4:50 PM  Result Value Ref Range   Sodium 135 135 - 145 mmol/L   Potassium 4.5 3.5 - 5.1 mmol/L   Chloride 101 98 - 111 mmol/L   CO2 24 22 - 32 mmol/L   Glucose, Bld 91 70 - 99 mg/dL   BUN 10 6 - 20 mg/dL   Creatinine, Ser 4.130.81 0.44 - 1.00 mg/dL   Calcium 9.3 8.9 - 24.410.3 mg/dL   Total  Protein 6.8  6.5 - 8.1 g/dL   Albumin 3.1 (L) 3.5 - 5.0 g/dL   AST 18 15 - 41 U/L   ALT 13 0 - 44 U/L   Alkaline Phosphatase 95 38 - 126 U/L   Total Bilirubin 0.3 0.3 - 1.2 mg/dL   GFR calc non Af Amer >60 >60 mL/min   GFR calc Af Amer >60 >60 mL/min   Anion gap 10 5 - 15  POCT fern test     Status: None   Collection Time: 05/24/18  5:04 PM  Result Value Ref Range   POCT Fern Test Negative = intact amniotic membranes     Imaging:  No results found.  MAU Course: Orders Placed This Encounter  Procedures  . Culture, OB Urine  . Urinalysis, Routine w reflex microscopic  . CBC  . Comprehensive metabolic panel  . Protein / creatinine ratio, urine  . Amnisure rupture of membrane (rom)not at [facility]  . POCT fern test  . Discharge patient   No orders of the defined types were placed in this encounter.   MDM: SSE performed, no pooling of fluid. Fern & amnisure negative. Cervix closed/thick.  Elevated BP x 2, none severe range. Pt asymptomatic & PEC labs negative Pt tachycardic. Comparable to previous visits. Patient asymptomatic.   Assessment: 1. Elevated BP without diagnosis of hypertension   2. Late prenatal care affecting pregnancy, antepartum   3. Encounter for suspected PROM, with rupture of membranes not found     Plan: Discharge home in stable condition.  Preterm Labor precautions and fetal kick counts Keep scheduled OB appt this Friday  Follow-up Information    [facility] Follow up.   Specialty:  Maternal and Fetal Medicine Why:  call to schedule anatomy ultrasound, order already in the computer Contact information: [address] [id number] mc [place] [address] [phone]          Allergies as of 05/24/2018   No Known Allergies     Medication List    TAKE these medications   PRENATAL COMPLETE 14-0.4 MG Tabs Take 1 tablet by mouth at bedtime.       Judeth Horn, NP 05/24/2018 5:39 PM

## 2018-05-26 LAB — CULTURE, OB URINE

## 2018-05-27 ENCOUNTER — Telehealth: Payer: Self-pay | Admitting: *Deleted

## 2018-05-27 ENCOUNTER — Ambulatory Visit (INDEPENDENT_AMBULATORY_CARE_PROVIDER_SITE_OTHER): Payer: Medicaid Other | Admitting: Obstetrics and Gynecology

## 2018-05-27 ENCOUNTER — Encounter: Payer: Self-pay | Admitting: Obstetrics and Gynecology

## 2018-05-27 VITALS — BP 109/64 | HR 79 | Wt 159.3 lb

## 2018-05-27 DIAGNOSIS — Z8632 Personal history of gestational diabetes: Secondary | ICD-10-CM

## 2018-05-27 DIAGNOSIS — A609 Anogenital herpesviral infection, unspecified: Secondary | ICD-10-CM | POA: Diagnosis not present

## 2018-05-27 DIAGNOSIS — O09293 Supervision of pregnancy with other poor reproductive or obstetric history, third trimester: Secondary | ICD-10-CM

## 2018-05-27 DIAGNOSIS — Z3483 Encounter for supervision of other normal pregnancy, third trimester: Secondary | ICD-10-CM | POA: Diagnosis present

## 2018-05-27 DIAGNOSIS — Z23 Encounter for immunization: Secondary | ICD-10-CM

## 2018-05-27 DIAGNOSIS — O0933 Supervision of pregnancy with insufficient antenatal care, third trimester: Secondary | ICD-10-CM

## 2018-05-27 DIAGNOSIS — Z3480 Encounter for supervision of other normal pregnancy, unspecified trimester: Secondary | ICD-10-CM

## 2018-05-27 DIAGNOSIS — O09299 Supervision of pregnancy with other poor reproductive or obstetric history, unspecified trimester: Secondary | ICD-10-CM

## 2018-05-27 NOTE — Patient Instructions (Signed)
Third Trimester of Pregnancy The third trimester is from week 28 through week 40 (months 7 through 9). The third trimester is a time when the unborn baby (fetus) is growing rapidly. At the end of the ninth month, the fetus is about 20 inches in length and weighs 6-10 pounds. Body changes during your third trimester Your body will continue to go through many changes during pregnancy. The changes vary from woman to woman. During the third trimester:  Your weight will continue to increase. You can expect to gain 25-35 pounds (11-16 kg) by the end of the pregnancy.  You may begin to get stretch marks on your hips, abdomen, and breasts.  You may urinate more often because the fetus is moving lower into your pelvis and pressing on your bladder.  You may develop or continue to have heartburn. This is caused by increased hormones that slow down muscles in the digestive tract.  You may develop or continue to have constipation because increased hormones slow digestion and cause the muscles that push waste through your intestines to relax.  You may develop hemorrhoids. These are swollen veins (varicose veins) in the rectum that can itch or be painful.  You may develop swollen, bulging veins (varicose veins) in your legs.  You may have increased body aches in the pelvis, back, or thighs. This is due to weight gain and increased hormones that are relaxing your joints.  You may have changes in your hair. These can include thickening of your hair, rapid growth, and changes in texture. Some women also have hair loss during or after pregnancy, or hair that feels dry or thin. Your hair will most likely return to normal after your baby is born.  Your breasts will continue to grow and they will continue to become tender. A yellow fluid (colostrum) may leak from your breasts. This is the first milk you are producing for your baby.  Your belly button may stick out.  You may notice more swelling in your hands,  face, or ankles.  You may have increased tingling or numbness in your hands, arms, and legs. The skin on your belly may also feel numb.  You may feel short of breath because of your expanding uterus.  You may have more problems sleeping. This can be caused by the size of your belly, increased need to urinate, and an increase in your body's metabolism.  You may notice the fetus "dropping," or moving lower in your abdomen (lightening).  You may have increased vaginal discharge.  You may notice your joints feel loose and you may have pain around your pelvic bone.  What to expect at prenatal visits You will have prenatal exams every 2 weeks until week 36. Then you will have weekly prenatal exams. During a routine prenatal visit:  You will be weighed to make sure you and the baby are growing normally.  Your blood pressure will be taken.  Your abdomen will be measured to track your baby's growth.  The fetal heartbeat will be listened to.  Any test results from the previous visit will be discussed.  You may have a cervical check near your due date to see if your cervix has softened or thinned (effaced).  You will be tested for Group B streptococcus. This happens between 35 and 37 weeks.  Your health care provider may ask you:  What your birth plan is.  How you are feeling.  If you are feeling the baby move.  If you have had   any abnormal symptoms, such as leaking fluid, bleeding, severe headaches, or abdominal cramping.  If you are using any tobacco products, including cigarettes, chewing tobacco, and electronic cigarettes.  If you have any questions.  Other tests or screenings that may be performed during your third trimester include:  Blood tests that check for low iron levels (anemia).  Fetal testing to check the health, activity level, and growth of the fetus. Testing is done if you have certain medical conditions or if there are problems during the  pregnancy.  Nonstress test (NST). This test checks the health of your baby to make sure there are no signs of problems, such as the baby not getting enough oxygen. During this test, a belt is placed around your belly. The baby is made to move, and its heart rate is monitored during movement.  What is false labor? False labor is a condition in which you feel small, irregular tightenings of the muscles in the womb (contractions) that usually go away with rest, changing position, or drinking water. These are called Braxton Hicks contractions. Contractions may last for hours, days, or even weeks before true labor sets in. If contractions come at regular intervals, become more frequent, increase in intensity, or become painful, you should see your health care provider. What are the signs of labor?  Abdominal cramps.  Regular contractions that start at 10 minutes apart and become stronger and more frequent with time.  Contractions that start on the top of the uterus and spread down to the lower abdomen and back.  Increased pelvic pressure and dull back pain.  A watery or bloody mucus discharge that comes from the vagina.  Leaking of amniotic fluid. This is also known as your "water breaking." It could be a slow trickle or a gush. Let your health care provider know if it has a color or strange odor. If you have any of these signs, call your health care provider right away, even if it is before your due date. Follow these instructions at home: Medicines  Follow your health care provider's instructions regarding medicine use. Specific medicines may be either safe or unsafe to take during pregnancy.  Take a prenatal vitamin that contains at least 600 micrograms (mcg) of folic acid.  If you develop constipation, try taking a stool softener if your health care provider approves. Eating and drinking  Eat a balanced diet that includes fresh fruits and vegetables, whole grains, good sources of protein  such as meat, eggs, or tofu, and low-fat dairy. Your health care provider will help you determine the amount of weight gain that is right for you.  Avoid raw meat and uncooked cheese. These carry germs that can cause birth defects in the baby.  If you have low calcium intake from food, talk to your health care provider about whether you should take a daily calcium supplement.  Eat four or five small meals rather than three large meals a day.  Limit foods that are high in fat and processed sugars, such as fried and sweet foods.  To prevent constipation: ? Drink enough fluid to keep your urine clear or pale yellow. ? Eat foods that are high in fiber, such as fresh fruits and vegetables, whole grains, and beans. Activity  Exercise only as directed by your health care provider. Most women can continue their usual exercise routine during pregnancy. Try to exercise for 30 minutes at least 5 days a week. Stop exercising if you experience uterine contractions.  Avoid heavy   lifting.  Do not exercise in extreme heat or humidity, or at high altitudes.  Wear low-heel, comfortable shoes.  Practice good posture.  You may continue to have sex unless your health care provider tells you otherwise. Relieving pain and discomfort  Take frequent breaks and rest with your legs elevated if you have leg cramps or low back pain.  Take warm sitz baths to soothe any pain or discomfort caused by hemorrhoids. Use hemorrhoid cream if your health care provider approves.  Wear a good support bra to prevent discomfort from breast tenderness.  If you develop varicose veins: ? Wear support pantyhose or compression stockings as told by your healthcare provider. ? Elevate your feet for 15 minutes, 3-4 times a day. Prenatal care  Write down your questions. Take them to your prenatal visits.  Keep all your prenatal visits as told by your health care provider. This is important. Safety  Wear your seat belt at  all times when driving.  Make a list of emergency phone numbers, including numbers for family, friends, the hospital, and police and fire departments. General instructions  Avoid cat litter boxes and soil used by cats. These carry germs that can cause birth defects in the baby. If you have a cat, ask someone to clean the litter box for you.  Do not travel far distances unless it is absolutely necessary and only with the approval of your health care provider.  Do not use hot tubs, steam rooms, or saunas.  Do not drink alcohol.  Do not use any products that contain nicotine or tobacco, such as cigarettes and e-cigarettes. If you need help quitting, ask your health care provider.  Do not use any medicinal herbs or unprescribed drugs. These chemicals affect the formation and growth of the baby.  Do not douche or use tampons or scented sanitary pads.  Do not cross your legs for long periods of time.  To prepare for the arrival of your baby: ? Take prenatal classes to understand, practice, and ask questions about labor and delivery. ? Make a trial run to the hospital. ? Visit the hospital and tour the maternity area. ? Arrange for maternity or paternity leave through employers. ? Arrange for family and friends to take care of pets while you are in the hospital. ? Purchase a rear-facing car seat and make sure you know how to install it in your car. ? Pack your hospital bag. ? Prepare the baby's nursery. Make sure to remove all pillows and stuffed animals from the baby's crib to prevent suffocation.  Visit your dentist if you have not gone during your pregnancy. Use a soft toothbrush to brush your teeth and be gentle when you floss. Contact a health care provider if:  You are unsure if you are in labor or if your water has broken.  You become dizzy.  You have mild pelvic cramps, pelvic pressure, or nagging pain in your abdominal area.  You have lower back pain.  You have persistent  nausea, vomiting, or diarrhea.  You have an unusual or bad smelling vaginal discharge.  You have pain when you urinate. Get help right away if:  Your water breaks before 37 weeks.  You have regular contractions less than 5 minutes apart before 37 weeks.  You have a fever.  You are leaking fluid from your vagina.  You have spotting or bleeding from your vagina.  You have severe abdominal pain or cramping.  You have rapid weight loss or weight gain.    You have shortness of breath with chest pain.  You notice sudden or extreme swelling of your face, hands, ankles, feet, or legs.  Your baby makes fewer than 10 movements in 2 hours.  You have severe headaches that do not go away when you take medicine.  You have vision changes. Summary  The third trimester is from week 28 through week 40, months 7 through 9. The third trimester is a time when the unborn baby (fetus) is growing rapidly.  During the third trimester, your discomfort may increase as you and your baby continue to gain weight. You may have abdominal, leg, and back pain, sleeping problems, and an increased need to urinate.  During the third trimester your breasts will keep growing and they will continue to become tender. A yellow fluid (colostrum) may leak from your breasts. This is the first milk you are producing for your baby.  False labor is a condition in which you feel small, irregular tightenings of the muscles in the womb (contractions) that eventually go away. These are called Braxton Hicks contractions. Contractions may last for hours, days, or even weeks before true labor sets in.  Signs of labor can include: abdominal cramps; regular contractions that start at 10 minutes apart and become stronger and more frequent with time; watery or bloody mucus discharge that comes from the vagina; increased pelvic pressure and dull back pain; and leaking of amniotic fluid. This information is not intended to replace advice  given to you by your health care provider. Make sure you discuss any questions you have with your health care provider. Document Released: 09/22/2001 Document Revised: 03/05/2016 Document Reviewed: 11/29/2012 Elsevier Interactive Patient Education  2017 [employer]  

## 2018-05-27 NOTE — Telephone Encounter (Signed)
Called pt to advise of US appt on 06/06/18 @ 2:45p. Pt verbalized understanding.

## 2018-05-27 NOTE — Progress Notes (Signed)
Subjective:  Shelly Tate is a 27 y.o. G4P3003 at 3153w4d being seen today for first OB visit. EDD by LMP. PMH/POBH as noted. Denies chronic medical problems or medications.  She is currently monitored for the following issues for this high-risk pregnancy and has History of gestational diabetes in prior pregnancy, currently pregnant; Hx of preeclampsia, prior pregnancy, currently pregnant; Late prenatal care affecting pregnancy; Supervision of other normal pregnancy, antepartum; and HSV (herpes simplex virus) anogenital infection on their problem list.  Patient reports no complaints.  Contractions: Not present. Vag. Bleeding: None.  Movement: Present. Denies leaking of fluid.   The following portions of the patient's history were reviewed and updated as appropriate: allergies, current medications, past family history, past medical history, past social history, past surgical history and problem list. Problem list updated.  Objective:   Vitals:   05/27/18 0904  BP: 109/64  Pulse: 79  Weight: 159 lb 4.8 oz (72.3 kg)    Fetal Status: Fetal Heart Rate (bpm): 141   Movement: Present     General:  Alert, oriented and cooperative. Patient is in no acute distress.  Skin: Skin is warm and dry. No rash noted.   Cardiovascular: Normal heart rate noted  Respiratory: Normal respiratory effort, no problems with respiration noted  Abdomen: Soft, gravid, appropriate for gestational age. Pain/Pressure: Present     Pelvic:  Cervical exam performed        Extremities: Normal range of motion.  Edema: None  Mental Status: Normal mood and affect. Normal behavior. Normal judgment and thought content.   Urinalysis:      Assessment and Plan:  Pregnancy: G4P3003 at 8453w4d  1. Supervision of other normal pregnancy, antepartum Prenatal labs and care reviewed with pt. Return next week for Glucola Nl A1C last month - Tdap vaccine greater than or equal to 7yo IM - US MFM OB DETAIL +14 WK; Future  2. Late  prenatal care affecting pregnancy in third trimester As above  3. Hx of preeclampsia, prior pregnancy, currently pregnant No S/Sx of PEC presently Will not start BASA d/t late gestational age  264. HSV (herpes simplex virus) anogenital infection Suppression at 36 weeks  5. History of gestational diabetes in prior pregnancy, currently pregnant Glucola next week Nl A1C in July  Preterm labor symptoms and general obstetric precautions including but not limited to vaginal bleeding, contractions, leaking of fluid and fetal movement were reviewed in detail with the patient. Please refer to After Visit Summary for other counseling recommendations.  Return in about 2 weeks (around 06/10/2018) for OB visit.   Hermina StaggersErvin, Arnav Cregg L, MD

## 2018-05-27 NOTE — Progress Notes (Signed)
Pt states been having cramps.

## 2018-06-06 ENCOUNTER — Ambulatory Visit (HOSPITAL_COMMUNITY)
Admission: RE | Admit: 2018-06-06 | Discharge: 2018-06-06 | Disposition: A | Discharge status: Home / Self Care | Payer: Medicaid Other | Source: Ambulatory Visit | Attending: Obstetrics and Gynecology | Admitting: Obstetrics and Gynecology

## 2018-06-06 DIAGNOSIS — Z363 Encounter for antenatal screening for malformations: Secondary | ICD-10-CM | POA: Diagnosis not present

## 2018-06-06 DIAGNOSIS — O09293 Supervision of pregnancy with other poor reproductive or obstetric history, third trimester: Secondary | ICD-10-CM

## 2018-06-06 DIAGNOSIS — Z3483 Encounter for supervision of other normal pregnancy, third trimester: Secondary | ICD-10-CM | POA: Insufficient documentation

## 2018-06-06 DIAGNOSIS — Z3A31 31 weeks gestation of pregnancy: Secondary | ICD-10-CM | POA: Diagnosis not present

## 2018-06-06 DIAGNOSIS — Z3687 Encounter for antenatal screening for uncertain dates: Secondary | ICD-10-CM | POA: Diagnosis not present

## 2018-06-06 DIAGNOSIS — O0933 Supervision of pregnancy with insufficient antenatal care, third trimester: Secondary | ICD-10-CM

## 2018-06-06 DIAGNOSIS — Z3480 Encounter for supervision of other normal pregnancy, unspecified trimester: Secondary | ICD-10-CM

## 2018-06-08 ENCOUNTER — Other Ambulatory Visit: Payer: Self-pay

## 2018-06-08 DIAGNOSIS — Z3480 Encounter for supervision of other normal pregnancy, unspecified trimester: Secondary | ICD-10-CM

## 2018-06-10 ENCOUNTER — Ambulatory Visit (INDEPENDENT_AMBULATORY_CARE_PROVIDER_SITE_OTHER): Payer: Medicaid Other | Admitting: Obstetrics & Gynecology

## 2018-06-10 ENCOUNTER — Other Ambulatory Visit: Payer: Medicaid Other

## 2018-06-10 VITALS — BP 121/71 | HR 90 | Wt 167.2 lb

## 2018-06-10 DIAGNOSIS — Z3480 Encounter for supervision of other normal pregnancy, unspecified trimester: Secondary | ICD-10-CM

## 2018-06-10 DIAGNOSIS — O2603 Excessive weight gain in pregnancy, third trimester: Secondary | ICD-10-CM | POA: Insufficient documentation

## 2018-06-10 DIAGNOSIS — O09299 Supervision of pregnancy with other poor reproductive or obstetric history, unspecified trimester: Secondary | ICD-10-CM

## 2018-06-10 DIAGNOSIS — Z8632 Personal history of gestational diabetes: Secondary | ICD-10-CM

## 2018-06-10 DIAGNOSIS — A609 Anogenital herpesviral infection, unspecified: Secondary | ICD-10-CM

## 2018-06-10 MED ORDER — VALACYCLOVIR HCL 500 MG PO TABS: 500.0000 mg | ORAL_TABLET | Freq: Two times a day (BID) | ORAL | 6 refills | Status: DC

## 2018-06-10 NOTE — Progress Notes (Signed)
Pt reports having dizziness and headaches x4 days.  2 hr GTT today

## 2018-06-10 NOTE — Progress Notes (Signed)
   PRENATAL VISIT NOTE  Subjective:  Shelly Tate is a 27 y.o. [phone] at 41101w4d being seen today for ongoing prenatal care.  She is currently monitored for the following issues for this high-risk pregnancy and has History of gestational diabetes in prior pregnancy, currently pregnant; Hx of preeclampsia, prior pregnancy, currently pregnant; Late prenatal care affecting pregnancy; Supervision of other normal pregnancy, antepartum; and HSV (herpes simplex virus) anogenital infection on their problem list.  Patient reports no complaints.  Contractions: Irregular. Vag. Bleeding: None.  Movement: Present. Denies leaking of fluid.   The following portions of the patient's history were reviewed and updated as appropriate: allergies, current medications, past family history, past medical history, past social history, past surgical history and problem list. Problem list updated.  Objective:   Vitals:   06/10/18 0907  BP: 121/71  Pulse: 90  Weight: 167 lb 3.2 oz (75.8 kg)    Fetal Status: Fetal Heart Rate (bpm): 156   Movement: Present     General:  Alert, oriented and cooperative. Patient is in no acute distress.  Skin: Skin is warm and dry. No rash noted.   Cardiovascular: Normal heart rate noted  Respiratory: Normal respiratory effort, no problems with respiration noted  Abdomen: Soft, gravid, appropriate for gestational age.  Pain/Pressure: Present     Pelvic: Cervical exam deferred        Extremities: Normal range of motion.  Edema: None  Mental Status: Normal mood and affect. Normal behavior. Normal judgment and thought content.   Assessment and Plan:  Pregnancy: G4P3003 at 41101w4d  1. History of gestational diabetes in prior pregnancy, currently pregnant   2. Hx of preeclampsia, prior pregnancy, currently pregnant   3. HSV (herpes simplex virus) anogenital infection - start valtrex at 34 weeks  4. Supervision of other normal pregnancy, antepartum  Preterm labor symptoms  and general obstetric precautions including but not limited to vaginal bleeding, contractions, leaking of fluid and fetal movement were reviewed in detail with the patient. Please refer to After Visit Summary for other counseling recommendations.  No follow-ups on file.  Future Appointments  Date Time Provider Department Center  06/10/2018  9:15 AM Allie Bossierove, Odean Mcelwain C, MD [facility]    Allie BossierMyra C Naleigha Raimondi, MD

## 2018-06-11 LAB — GLUCOSE TOLERANCE, 2 HOURS W/ 1HR
GLUCOSE, 2 HOUR: 86 mg/dL (ref 65–152)
Glucose, 1 hour: 99 mg/dL (ref 65–179)
Glucose, Fasting: 74 mg/dL (ref 65–91)

## 2018-06-24 ENCOUNTER — Ambulatory Visit (INDEPENDENT_AMBULATORY_CARE_PROVIDER_SITE_OTHER): Payer: Medicaid Other | Admitting: Obstetrics & Gynecology

## 2018-06-24 VITALS — BP 131/76 | HR 96 | Wt 168.7 lb

## 2018-06-24 DIAGNOSIS — O09293 Supervision of pregnancy with other poor reproductive or obstetric history, third trimester: Secondary | ICD-10-CM

## 2018-06-24 DIAGNOSIS — O0993 Supervision of high risk pregnancy, unspecified, third trimester: Secondary | ICD-10-CM | POA: Diagnosis not present

## 2018-06-24 DIAGNOSIS — A609 Anogenital herpesviral infection, unspecified: Secondary | ICD-10-CM

## 2018-06-24 DIAGNOSIS — O0990 Supervision of high risk pregnancy, unspecified, unspecified trimester: Secondary | ICD-10-CM

## 2018-06-24 DIAGNOSIS — Z3480 Encounter for supervision of other normal pregnancy, unspecified trimester: Secondary | ICD-10-CM

## 2018-06-24 DIAGNOSIS — O09299 Supervision of pregnancy with other poor reproductive or obstetric history, unspecified trimester: Secondary | ICD-10-CM

## 2018-06-24 DIAGNOSIS — Z23 Encounter for immunization: Secondary | ICD-10-CM

## 2018-06-24 NOTE — Progress Notes (Signed)
   PRENATAL VISIT NOTE  Subjective:  Shelly Tate is a 27 y.o. [id number] at 6916w4d being seen today for ongoing prenatal care.  She is currently monitored for the following issues for this low-risk pregnancy and has History of gestational diabetes in prior pregnancy, currently pregnant; Hx of preeclampsia, prior pregnancy, currently pregnant; Late prenatal care affecting pregnancy; Supervision of other normal pregnancy, antepartum; HSV (herpes simplex virus) anogenital infection; and Excessive weight gain during pregnancy in third trimester on their problem list.  Patient reports cramping, no ROM, no bleeding.  Contractions: Irregular. Vag. Bleeding: None.  Movement: Present. Denies leaking of fluid.   The following portions of the patient's history were reviewed and updated as appropriate: allergies, current medications, past family history, past medical history, past social history, past surgical history and problem list. Problem list updated.  Objective:   Vitals:   06/24/18 1007  BP: 131/76  Pulse: 96  Weight: 168 lb 11.2 oz (76.5 kg)    Fetal Status: Fetal Heart Rate (bpm): 155   Movement: Present     General:  Alert, oriented and cooperative. Patient is in no acute distress.  Skin: Skin is warm and dry. No rash noted.   Cardiovascular: Normal heart rate noted  Respiratory: Normal respiratory effort, no problems with respiration noted  Abdomen: Soft, gravid, appropriate for gestational age.  Pain/Pressure: Present     Pelvic: Cervical exam performed        Extremities: Normal range of motion.  Edema: None  Mental Status: Normal mood and affect. Normal behavior. Normal judgment and thought content.   Assessment and Plan:  Pregnancy: G4P3003 at 5916w4d  1. Hx of preeclampsia, prior pregnancy, currently pregnant   2. HSV (herpes simplex virus) anogenital infection -Start Valtrex next week  3. Supervision of high risk pregnancy, antepartum  - Flu Vaccine QUAD 36+ mos IM  (Fluarix, Quad PF)  4. Supervision of other normal pregnancy, antepartum   Preterm labor symptoms and general obstetric precautions including but not limited to vaginal bleeding, contractions, leaking of fluid and fetal movement were reviewed in detail with the patient. Please refer to After Visit Summary for other counseling recommendations.  No follow-ups on file.  No future appointments.  Shelly BossierMyra C Meeyah Ovitt, MD

## 2018-07-05 ENCOUNTER — Inpatient Hospital Stay (HOSPITAL_COMMUNITY)
Admission: AD | Admit: 2018-07-05 | Discharge: 2018-07-05 | Disposition: A | Discharge status: Home / Self Care | Payer: Medicaid Other | Source: Ambulatory Visit | Attending: Obstetrics and Gynecology | Admitting: Obstetrics and Gynecology

## 2018-07-05 ENCOUNTER — Encounter (HOSPITAL_COMMUNITY): Payer: Self-pay

## 2018-07-05 ENCOUNTER — Other Ambulatory Visit: Payer: Self-pay

## 2018-07-05 DIAGNOSIS — Z3A35 35 weeks gestation of pregnancy: Secondary | ICD-10-CM | POA: Insufficient documentation

## 2018-07-05 DIAGNOSIS — M549 Dorsalgia, unspecified: Secondary | ICD-10-CM

## 2018-07-05 DIAGNOSIS — M545 Low back pain: Secondary | ICD-10-CM | POA: Insufficient documentation

## 2018-07-05 DIAGNOSIS — O0933 Supervision of pregnancy with insufficient antenatal care, third trimester: Secondary | ICD-10-CM

## 2018-07-05 DIAGNOSIS — O26893 Other specified pregnancy related conditions, third trimester: Secondary | ICD-10-CM | POA: Insufficient documentation

## 2018-07-05 DIAGNOSIS — O9989 Other specified diseases and conditions complicating pregnancy, childbirth and the puerperium: Secondary | ICD-10-CM | POA: Diagnosis not present

## 2018-07-05 DIAGNOSIS — Z87891 Personal history of nicotine dependence: Secondary | ICD-10-CM | POA: Diagnosis not present

## 2018-07-05 DIAGNOSIS — O2603 Excessive weight gain in pregnancy, third trimester: Secondary | ICD-10-CM

## 2018-07-05 LAB — URINALYSIS, ROUTINE W REFLEX MICROSCOPIC
BILIRUBIN URINE: NEGATIVE
Glucose, UA: NEGATIVE mg/dL
Hgb urine dipstick: NEGATIVE
Ketones, ur: NEGATIVE mg/dL
Nitrite: NEGATIVE
PH: 5 (ref 5.0–8.0)
Protein, ur: NEGATIVE mg/dL
SPECIFIC GRAVITY, URINE: 1.027 (ref 1.005–1.030)

## 2018-07-05 LAB — WET PREP, GENITAL
CLUE CELLS WET PREP: NONE SEEN
SPERM: NONE SEEN
TRICH WET PREP: NONE SEEN
YEAST WET PREP: NONE SEEN

## 2018-07-05 MED ORDER — ACETAMINOPHEN 500 MG PO TABS
1000.0000 mg | ORAL_TABLET | Freq: Once | ORAL | Status: AC
  Administered 2018-07-05: 1000 mg via ORAL
  Filled 2018-07-05: qty 2

## 2018-07-05 NOTE — MAU Note (Signed)
Be cramping and having cramps in vagina and her back.  Started on Monday, getting harder and harder; getting closer. Like period cramps

## 2018-07-05 NOTE — Discharge Instructions (Signed)
Fetal Movement Counts °Patient Name: ________________________________________________ Patient Due Date: ____________________ °What is a fetal movement count? °A fetal movement count is the number of times that you feel your baby move during a certain amount of time. This may also be called a fetal kick count. A fetal movement count is recommended for every pregnant woman. You may be asked to start counting fetal movements as early as week 28 of your pregnancy. °Pay attention to when your baby is most active. You may notice your baby's sleep and wake cycles. You may also notice things that make your baby move more. You should do a fetal movement count: °· When your baby is normally most active. °· At the same time each day. ° °A good time to count movements is while you are resting, after having something to eat and drink. °How do I count fetal movements? °1. Find a quiet, comfortable area. Sit, or lie down on your side. °2. Write down the date, the start time and stop time, and the number of movements that you felt between those two times. Take this information with you to your health care visits. °3. For 2 hours, count kicks, flutters, swishes, rolls, and jabs. You should feel at least 10 movements during 2 hours. °4. You may stop counting after you have felt 10 movements. °5. If you do not feel 10 movements in 2 hours, have something to eat and drink. Then, keep resting and counting for 1 hour. If you feel at least 4 movements during that hour, you may stop counting. °Contact a health care provider if: °· You feel fewer than 4 movements in 2 hours. °· Your baby is not moving like he or she usually does. °Date: ____________ Start time: ____________ Stop time: ____________ Movements: ____________ °Date: ____________ Start time: ____________ Stop time: ____________ Movements: ____________ °Date: ____________ Start time: ____________ Stop time: ____________ Movements: ____________ °Date: ____________ Start time:  ____________ Stop time: ____________ Movements: ____________ °Date: ____________ Start time: ____________ Stop time: ____________ Movements: ____________ °Date: ____________ Start time: ____________ Stop time: ____________ Movements: ____________ °Date: ____________ Start time: ____________ Stop time: ____________ Movements: ____________ °Date: ____________ Start time: ____________ Stop time: ____________ Movements: ____________ °Date: ____________ Start time: ____________ Stop time: ____________ Movements: ____________ °This information is not intended to replace advice given to you by your health care provider. Make sure you discuss any questions you have with your health care provider. °Document Released: 10/28/2006 Document Revised: 05/27/2016 Document Reviewed: 11/07/2015 °Elsevier Interactive Patient Education © 2018 [employer] °Braxton Hicks Contractions °Contractions of the uterus can occur throughout pregnancy, but they are not always a sign that you are in labor. You may have practice contractions called Braxton Hicks contractions. These false labor contractions are sometimes confused with true labor. °What are Braxton Hicks contractions? °Braxton Hicks contractions are tightening movements that occur in the muscles of the uterus before labor. Unlike true labor contractions, these contractions do not result in opening (dilation) and thinning of the cervix. Toward the end of pregnancy (32-34 weeks), Braxton Hicks contractions can happen more often and may become stronger. These contractions are sometimes difficult to tell apart from true labor because they can be very uncomfortable. You should not feel embarrassed if you go to the hospital with false labor. °Sometimes, the only way to tell if you are in true labor is for your health care provider to look for changes in the cervix. The health care provider will do a physical exam and may monitor your contractions. If   you are not in true labor, the exam  should show that your cervix is not dilating and your water has not broken. If there are other health problems associated with your pregnancy, it is completely safe for you to be sent home with false labor. You may continue to have Braxton Hicks contractions until you go into true labor. How to tell the difference between true labor and false labor True labor  Contractions last 30-70 seconds.  Contractions become very regular.  Discomfort is usually felt in the top of the uterus, and it spreads to the lower abdomen and low back.  Contractions do not go away with walking.  Contractions usually become more intense and increase in frequency.  The cervix dilates and gets thinner. False labor  Contractions are usually shorter and not as strong as true labor contractions.  Contractions are usually irregular.  Contractions are often felt in the front of the lower abdomen and in the groin.  Contractions may go away when you walk around or change positions while lying down.  Contractions get weaker and are shorter-lasting as time goes on.  The cervix usually does not dilate or become thin. Follow these instructions at home:  Take over-the-counter and prescription medicines only as told by your health care provider.  Keep up with your usual exercises and follow other instructions from your health care provider.  Eat and drink lightly if you think you are going into labor.  If Braxton Hicks contractions are making you uncomfortable: ? Change your position from lying down or resting to walking, or change from walking to resting. ? Sit and rest in a tub of warm water. ? Drink enough fluid to keep your urine pale yellow. Dehydration may cause these contractions. ? Do slow and deep breathing several times an hour.  Keep all follow-up prenatal visits as told by your health care provider. This is important. Contact a health care provider if:  You have a fever.  You have continuous pain  in your abdomen. Get help right away if:  Your contractions become stronger, more regular, and closer together.  You have fluid leaking or gushing from your vagina.  You pass blood-tinged mucus (bloody show).  You have bleeding from your vagina.  You have low back pain that you never had before.  You feel your babys head pushing down and causing pelvic pressure.  Your baby is not moving inside you as much as it used to. Summary  Contractions that occur before labor are called Braxton Hicks contractions, false labor, or practice contractions.  Braxton Hicks contractions are usually shorter, weaker, farther apart, and less regular than true labor contractions. True labor contractions usually become progressively stronger and regular and they become more frequent.  [facility] by changing position, resting in a warm bath, drinking plenty of water, or practicing deep breathing. This information is not intended to replace advice given to you by your health care provider. Make sure you discuss any questions you have with your health care provider. Document Released: 02/11/2017 Document Revised: 02/11/2017 Document Reviewed: 02/11/2017 Elsevier Interactive Patient Education  2018 [employer] Inc[employer] Back Pain in Pregnancy Back pain during pregnancy is common. Back pain may be caused by several factors that are related to changes during your pregnancy. Follow these instructions at home: Managing pain, stiffness, and swelling  If directed, apply ice for sudden (acute) back pain. ? Put ice in a plastic bag. ? Place a towel between your skin and  the bag. ? Leave the ice on for 20 minutes, 2-3 times per day.  If directed, apply heat to the affected area before you exercise: ? Place a towel between your skin and the heat pack or heating pad. ? Leave the heat on for 20-30 minutes. ? Remove the heat if your skin turns bright red. This is especially important  if you are unable to feel pain, heat, or cold. You may have a greater risk of getting burned. Activity  Exercise as told by your health care provider. Exercising is the best way to prevent or manage back pain.  Listen to your body when lifting. If lifting hurts, ask for help or bend your knees. This uses your leg muscles instead of your back muscles.  Squat down when picking up something from the floor. Do not bend over.  Only use bed rest as told by your health care provider. Bed rest should only be used for the most severe episodes of back pain. Standing, Sitting, and Lying Down  Do not stand in one place for long periods of time.  Use good posture when sitting. Make sure your head rests over your shoulders and is not hanging forward. Use a pillow on your lower back if necessary.  Try sleeping on your side, preferably the left side, with a pillow or two between your legs. If you are sore after a night's rest, your bed may be too soft. A firm mattress may provide more support for your back during pregnancy. General instructions  Do not wear high heels.  Eat a healthy diet. Try to gain weight within your health care provider's recommendations.  Use a maternity girdle, elastic sling, or back brace as told by your health care provider.  Take over-the-counter and prescription medicines only as told by your health care provider.  Keep all follow-up visits as told by your health care provider. This is important. This includes any visits with any specialists, such as a physical therapist. Contact a health care provider if:  Your back pain interferes with your daily activities.  You have increasing pain in other parts of your body. Get help right away if:  You develop numbness, tingling, weakness, or problems with the use of your arms or legs.  You develop severe back pain that is not controlled with medicine.  You have a sudden change in bowel or bladder control.  You develop  shortness of breath, dizziness, or you faint.  You develop nausea, vomiting, or sweating.  You have back pain that is a rhythmic, cramping pain similar to labor pains. Labor pain is usually 1-2 minutes apart, lasts for about 1 minute, and involves a bearing down feeling or pressure in your pelvis.  You have back pain and your water breaks or you have vaginal bleeding.  You have back pain or numbness that travels down your leg.  Your back pain developed after you fell.  You develop pain on one side of your back.  You see blood in your urine.  You develop skin blisters in the area of your back pain. This information is not intended to replace advice given to you by your health care provider. Make sure you discuss any questions you have with your health care provider. Document Released: 01/06/2006 Document Revised: 03/05/2016 Document Reviewed: 06/12/2015 Elsevier Interactive Patient Education  [employer].

## 2018-07-05 NOTE — MAU Provider Note (Signed)
History     CSN: [id number]  Arrival date and time: 07/05/18 1152   First Provider Initiated Contact with Patient 07/05/18 1311      Chief Complaint  Patient presents with  . Abdominal Pain  . Back Pain   HPI Shelly Tate is a 27 y.o. [phone] at [redacted]w[redacted]d who presents to [facility]U today with complaint of low back cramping rated at 7/10 today. She has not tried anything for pain. She denies bleeding, LOF, discharge, UTI symptoms or recent N/V/D or constipation. She reports normal fetal movement and denies contractions. She denies issues with this pregnancy.   OB History    Gravida  4   Para  3   Term  3   Preterm      AB      Living  3     SAB      TAB      Ectopic      Multiple  0   Live Births  3           Past Medical History:  Diagnosis Date  . Chlamydia   . Gestational diabetes   . Trichomonas infection     Past Surgical History:  Procedure Laterality Date  . NO PAST SURGERIES      Family History  Problem Relation Age of Onset  . Diabetes Mother   . Stroke Paternal Grandmother   . Alcohol abuse Neg Hx   . Arthritis Neg Hx   . Asthma Neg Hx   . Birth defects Neg Hx   . Cancer Neg Hx   . COPD Neg Hx   . Depression Neg Hx   . Drug abuse Neg Hx   . Early death Neg Hx   . Hearing loss Neg Hx   . Heart disease Neg Hx   . Hyperlipidemia Neg Hx   . Hypertension Neg Hx   . Kidney disease Neg Hx   . Learning disabilities Neg Hx   . Mental illness Neg Hx   . Mental retardation Neg Hx   . Miscarriages / Stillbirths Neg Hx   . Vision loss Neg Hx     Social History   Tobacco Use  . Smoking status: Former Smoker    Packs/day: 0.25    Types: Cigarettes    Last attempt to quit: 11/18/2016    Years since quitting: 1.6  . Smokeless tobacco: Never Used  Substance Use Topics  . Alcohol use: No  . Drug use: No    Allergies: No Known Allergies  Medications Prior to Admission  Medication Sig Dispense Refill Last Dose  . Prenatal Vit-Fe  Fumarate-FA (PRENATAL COMPLETE) 14-0.4 MG TABS Take 1 tablet by mouth at bedtime. 60 each 4 07/04/2018 at Unknown time  . valACYclovir (VALTREX) 500 MG tablet Take 1 tablet (500 mg total) by mouth 2 (two) times daily. 60 tablet 6 07/05/2018 at Unknown time    Review of Systems  Constitutional: Negative for fever.  Gastrointestinal: Negative for abdominal pain, constipation, diarrhea, nausea and vomiting.  Genitourinary: Negative for dysuria, frequency, urgency, vaginal bleeding and vaginal discharge.  Musculoskeletal: Positive for back pain.   Physical Exam   Blood pressure 118/76, pulse (!) 120, temperature 98.1 F (36.7 C), temperature source Oral, resp. rate 18, weight 79.5 kg, last menstrual period 02/28/2018, SpO2 99 %, unknown if currently breastfeeding.  Physical Exam  Nursing note and vitals reviewed. Constitutional: She is oriented to person, place, and time. She appears well-developed and well-nourished. No distress.  HENT:  Head: Normocephalic and atraumatic.  Cardiovascular: Normal rate.  Respiratory: Effort normal.  GI: Soft. She exhibits no distension.  Neurological: She is alert and oriented to person, place, and time.  Skin: Skin is warm and dry. No erythema.  Psychiatric: She has a normal mood and affect.    Cervical Exam:  Dilation: 1 Effacement (%): 40 Cervical Position: Middle Station: -2 Presentation: Vertex Exam by:: Ginnie Smartachel Schmidt RN   Results for orders placed or performed during the hospital encounter of 07/05/18 (from the past 24 hour(s))  Urinalysis, Routine w reflex microscopic     Status: Abnormal   Collection Time: 07/05/18 12:13 PM  Result Value Ref Range   Color, Urine YELLOW YELLOW   APPearance HAZY (A) CLEAR   Specific Gravity, Urine 1.027 1.005 - 1.030   pH 5.0 5.0 - 8.0   Glucose, UA NEGATIVE NEGATIVE mg/dL   Hgb urine dipstick NEGATIVE NEGATIVE   Bilirubin Urine NEGATIVE NEGATIVE   Ketones, ur NEGATIVE NEGATIVE mg/dL   Protein, ur  NEGATIVE NEGATIVE mg/dL   Nitrite NEGATIVE NEGATIVE   Leukocytes, UA SMALL (A) NEGATIVE   RBC / HPF 11-20 0 - 5 RBC/hpf   WBC, UA 11-20 0 - 5 WBC/hpf   Bacteria, UA RARE (A) NONE SEEN   Squamous Epithelial / LPF 6-10 0 - 5   Mucus PRESENT   Wet prep, genital     Status: Abnormal   Collection Time: 07/05/18  1:23 PM  Result Value Ref Range   Yeast Wet Prep HPF POC NONE SEEN NONE SEEN   Trich, Wet Prep NONE SEEN NONE SEEN   Clue Cells Wet Prep HPF POC NONE SEEN NONE SEEN   WBC, Wet Prep HPF POC MANY (A) NONE SEEN   Sperm NONE SEEN     Fetal Monitoring: Baseline: 130 bpm Variability: moderate Accelerations: 15 x 15 Decelerations: none Contractions: few, irregular   MAU Course  Procedures None  MDM UA today without evidence of infection or dehydration Wet prep and GC/Chlamydia obtained 1G Tylenol given in MAU. PO hydration encouraged.   Assessment and Plan  A: SIUP at 3177w1d Back pain in pregnancy, third trimester   P: Discharge home Tylenol PRN for pain  Abdominal binder recommended  Preterm labor precautions discussed Patient advised to follow-up with [facility] as scheduled for routine prenatal care this week Patient may return to MAU as needed or if her condition were to change or worsen  Vonzella NippleJulie Colleena Kurtenbach, PA-C 07/05/2018, 2:22 PM

## 2018-07-06 LAB — CULTURE, OB URINE: Culture: <10000 — AB

## 2018-07-06 LAB — GC/CHLAMYDIA PROBE AMP (~~LOC~~) NOT AT ARMC
Chlamydia: NEGATIVE
Neisseria Gonorrhea: NEGATIVE

## 2018-07-08 ENCOUNTER — Ambulatory Visit (INDEPENDENT_AMBULATORY_CARE_PROVIDER_SITE_OTHER): Payer: Medicaid Other | Admitting: Family Medicine

## 2018-07-08 VITALS — BP 121/84 | HR 90 | Wt 176.0 lb

## 2018-07-08 DIAGNOSIS — A609 Anogenital herpesviral infection, unspecified: Secondary | ICD-10-CM

## 2018-07-08 DIAGNOSIS — Z8632 Personal history of gestational diabetes: Secondary | ICD-10-CM

## 2018-07-08 DIAGNOSIS — Z3480 Encounter for supervision of other normal pregnancy, unspecified trimester: Secondary | ICD-10-CM

## 2018-07-08 DIAGNOSIS — O09299 Supervision of pregnancy with other poor reproductive or obstetric history, unspecified trimester: Secondary | ICD-10-CM

## 2018-07-08 NOTE — Progress Notes (Signed)
Patient reports cramping & watery discharge that started this morning

## 2018-07-08 NOTE — Patient Instructions (Signed)

## 2018-07-08 NOTE — Progress Notes (Signed)
   PRENATAL VISIT NOTE  Subjective:  Shelly Tate is a 27 y.o. [phone] at [redacted]w[redacted]d being seen today for ongoing prenatal care.  She is currently monitored for the following issues for this low-risk pregnancy and has History of gestational diabetes in prior pregnancy, currently pregnant; Hx of preeclampsia, prior pregnancy, currently pregnant; Late prenatal care affecting pregnancy; Supervision of other normal pregnancy, antepartum; HSV (herpes simplex virus) anogenital infection; and Excessive weight gain during pregnancy in third trimester on their problem list.  Patient reports watery vaginal discharge.  Contractions: Irregular. Vag. Bleeding: None.  Movement: Present. Denies leaking of fluid.   The following portions of the patient's history were reviewed and updated as appropriate: allergies, current medications, past family history, past medical history, past social history, past surgical history and problem list. Problem list updated.  Objective:   Vitals:   07/08/18 1113  BP: 121/84  Pulse: 90  Weight: 176 lb (79.8 kg)    Fetal Status: Fetal Heart Rate (bpm): 136 Fundal Height: 33 cm Movement: Present     General:  Alert, oriented and cooperative. Patient is in no acute distress.  Skin: Skin is warm and dry. No rash noted.   Cardiovascular: Normal heart rate noted  Respiratory: Normal respiratory effort, no problems with respiration noted  Abdomen: Soft, gravid, appropriate for gestational age.  Pain/Pressure: Present     Pelvic: Cervical exam performed Dilation: 1 Effacement (%): Thick Station: -2 negative pool, negative ferning  Extremities: Normal range of motion.  Edema: Trace  Mental Status: Normal mood and affect. Normal behavior. Normal judgment and thought content.   Assessment and Plan:  Pregnancy: G4P3003 at [redacted]w[redacted]d  1. Supervision of other normal pregnancy, antepartum Continue routine prenatal care. Cultures next visit If leaking gets worse, return to  MAU--cannot confirm PROM right now  2. History of gestational diabetes in prior pregnancy, currently pregnant Passed 2 hour  3. HSV (herpes simplex virus) anogenital infection On Valtrex, continue  4. Hx of preeclampsia, prior pregnancy, currently pregnant Nml BP today  Preterm labor symptoms and general obstetric precautions including but not limited to vaginal bleeding, contractions, leaking of fluid and fetal movement were reviewed in detail with the patient. Please refer to After Visit Summary for other counseling recommendations.  Return in 1 week (on 07/15/2018).  Future Appointments  Date Time Provider Department Center  07/18/2018 10:15 AM Adam Phenix, MD [facility]  07/25/2018  8:15 AM Hermina Staggers, MD [facility]  08/01/2018 10:15 AM Adam Phenix, MD [facility]  08/08/2018  8:15 AM Levie Heritage, DO [facility]    Reva Bores, MD

## 2018-07-18 ENCOUNTER — Ambulatory Visit (INDEPENDENT_AMBULATORY_CARE_PROVIDER_SITE_OTHER): Payer: Medicaid Other | Admitting: Obstetrics & Gynecology

## 2018-07-18 VITALS — BP 127/82 | HR 97 | Wt 179.6 lb

## 2018-07-18 DIAGNOSIS — O09299 Supervision of pregnancy with other poor reproductive or obstetric history, unspecified trimester: Secondary | ICD-10-CM

## 2018-07-18 DIAGNOSIS — Z3480 Encounter for supervision of other normal pregnancy, unspecified trimester: Secondary | ICD-10-CM

## 2018-07-18 NOTE — Progress Notes (Signed)
   PRENATAL VISIT NOTE  Subjective:  Shelly Tate is a 27 y.o. [phone] at [redacted]w[redacted]d being seen today for ongoing prenatal care.  She is currently monitored for the following issues for this low-risk pregnancy and has History of gestational diabetes in prior pregnancy, currently pregnant; Hx of preeclampsia, prior pregnancy, currently pregnant; Late prenatal care affecting pregnancy; Supervision of other normal pregnancy, antepartum; HSV (herpes simplex virus) anogenital infection; and Excessive weight gain during pregnancy in third trimester on their problem list.  Patient reports no complaints.  Contractions: Irregular. Vag. Bleeding: None.  Movement: Present. Denies leaking of fluid.   The following portions of the patient's history were reviewed and updated as appropriate: allergies, current medications, past family history, past medical history, past social history, past surgical history and problem list. Problem list updated.  Objective:   Vitals:   07/18/18 1009  BP: 127/82  Pulse: 97  Weight: 81.5 kg    Fetal Status: Fetal Heart Rate (bpm): 141   Movement: Present     General:  Alert, oriented and cooperative. Patient is in no acute distress.  Skin: Skin is warm and dry. No rash noted.   Cardiovascular: Normal heart rate noted  Respiratory: Normal respiratory effort, no problems with respiration noted  Abdomen: Soft, gravid, appropriate for gestational age.  Pain/Pressure: Present     Pelvic: Cervical exam deferred Dilation: 1 Effacement (%): 20 Station: -3  Extremities: Normal range of motion.  Edema: Mild pitting, slight indentation  Mental Status: Normal mood and affect. Normal behavior. Normal judgment and thought content.   Assessment and Plan:  Pregnancy: G4P3003 at [redacted]w[redacted]d  1. Supervision of other normal pregnancy, antepartum Routine labs - Culture, beta strep (group b only)  2. Hx of preeclampsia, prior pregnancy, currently pregnant No evidence of preeclampsia -  Culture, beta strep (group b only)  Term labor symptoms and general obstetric precautions including but not limited to vaginal bleeding, contractions, leaking of fluid and fetal movement were reviewed in detail with the patient. Please refer to After Visit Summary for other counseling recommendations.  Return in about 1 week (around 07/25/2018).  Future Appointments  Date Time Provider Department Center  07/25/2018  8:15 AM Hermina Staggers, MD [facility]  08/01/2018 10:15 AM Adam Phenix, MD [facility]  08/08/2018  8:15 AM Levie Heritage, DO [facility]    Scheryl Darter, MD

## 2018-07-18 NOTE — Patient Instructions (Signed)
Vaginal Delivery Vaginal delivery means that you will give birth by pushing your baby out of your birth canal (vagina). A team of health care providers will help you before, during, and after vaginal delivery. Birth experiences are unique for every woman and every pregnancy, and birth experiences vary depending on where you choose to give birth. What should I do to prepare for my baby's birth? Before your baby is born, it is important to talk with your health care provider about:  Your labor and delivery preferences. These may include: ? Medicines that you may be given. ? How you will manage your pain. This might include non-medical pain relief techniques or injectable pain relief such as epidural analgesia. ? How you and your baby will be monitored during labor and delivery. ? Who may be in the labor and delivery room with you. ? Your feelings about surgical delivery of your baby (cesarean delivery, or C-section) if this becomes necessary. ? Your feelings about receiving donated blood through an IV tube (blood transfusion) if this becomes necessary.  Whether you are able: ? To take pictures or videos of the birth. ? To eat during labor and delivery. ? To move around, walk, or change positions during labor and delivery.  What to expect after your baby is born, such as: ? Whether delayed umbilical cord clamping and cutting is offered. ? Who will care for your baby right after birth. ? Medicines or tests that may be recommended for your baby. ? Whether breastfeeding is supported in your hospital or birth center. ? How long you will be in the hospital or birth center.  How any medical conditions you have may affect your baby or your labor and delivery experience.  To prepare for your baby's birth, you should also:  Attend all of your health care visits before delivery (prenatal visits) as recommended by your health care provider. This is important.  Prepare your home for your baby's  arrival. Make sure that you have: ? Diapers. ? Baby clothing. ? Feeding equipment. ? Safe sleeping arrangements for you and your baby.  Install a car seat in your vehicle. Have your car seat checked by a certified car seat installer to make sure that it is installed safely.  Think about who will help you with your new baby at home for at least the first several weeks after delivery.  What can I expect when I arrive at the birth center or hospital? Once you are in labor and have been admitted into the hospital or birth center, your health care provider may:  Review your pregnancy history and any concerns you have.  Insert an IV tube into one of your veins. This is used to give you fluids and medicines.  Check your blood pressure, pulse, temperature, and heart rate (vital signs).  Check whether your bag of water (amniotic sac) has broken (ruptured).  Talk with you about your birth plan and discuss pain control options.  Monitoring Your health care provider may monitor your contractions (uterine monitoring) and your baby's heart rate (fetal monitoring). You may need to be monitored:  Often, but not continuously (intermittently).  All the time or for long periods at a time (continuously). Continuous monitoring may be needed if: ? You are taking certain medicines, such as medicine to relieve pain or make your contractions stronger. ? You have pregnancy or labor complications.  Monitoring may be done by:  Placing a special stethoscope or a handheld monitoring device on your abdomen to   check your baby's heartbeat, and feeling your abdomen for contractions. This method of monitoring does not continuously record your baby's heartbeat or your contractions.  Placing monitors on your abdomen (external monitors) to record your baby's heartbeat and the frequency and length of contractions. You may not have to wear external monitors all the time.  Placing monitors inside of your uterus  (internal monitors) to record your baby's heartbeat and the frequency, length, and strength of your contractions. ? Your health care provider may use internal monitors if he or she needs more information about the strength of your contractions or your baby's heart rate. ? Internal monitors are put in place by passing a thin, flexible wire through your vagina and into your uterus. Depending on the type of monitor, it may remain in your uterus or on your baby's head until birth. ? Your health care provider will discuss the benefits and risks of internal monitoring with you and will ask for your permission before inserting the monitors.  Telemetry. This is a type of continuous monitoring that can be done with external or internal monitors. Instead of having to stay in bed, you are able to move around during telemetry. Ask your health care provider if telemetry is an option for you.  Physical exam Your health care provider may perform a physical exam. This may include:  Checking whether your baby is positioned: ? With the head toward your vagina (head-down). This is most common. ? With the head toward the top of your uterus (head-up or breech). If your baby is in a breech position, your health care provider may try to turn your baby to a head-down position so you can deliver vaginally. If it does not seem that your baby can be born vaginally, your provider may recommend surgery to deliver your baby. In rare cases, you may be able to deliver vaginally if your baby is head-up (breech delivery). ? Lying sideways (transverse). Babies that are lying sideways cannot be delivered vaginally.  Checking your cervix to determine: ? Whether it is thinning out (effacing). ? Whether it is opening up (dilating). ? How low your baby has moved into your birth canal.  What are the three stages of labor and delivery?  Normal labor and delivery is divided into the following three stages: Stage 1  Stage 1 is the  longest stage of labor, and it can last for hours or days. Stage 1 includes: ? Early labor. This is when contractions may be irregular, or regular and mild. Generally, early labor contractions are more than 10 minutes apart. ? Active labor. This is when contractions get longer, more regular, more frequent, and more intense. ? The transition phase. This is when contractions happen very close together, are very intense, and may last longer than during any other part of labor.  Contractions generally feel mild, infrequent, and irregular at first. They get stronger, more frequent (about every 2-3 minutes), and more regular as you progress from early labor through active labor and transition.  Many women progress through stage 1 naturally, but you may need help to continue making progress. If this happens, your health care provider may talk with you about: ? Rupturing your amniotic sac if it has not ruptured yet. ? Giving you medicine to help make your contractions stronger and more frequent.  Stage 1 ends when your cervix is completely dilated to 4 inches (10 cm) and completely effaced. This happens at the end of the transition phase. Stage 2  Once   your cervix is completely effaced and dilated to 4 inches (10 cm), you may start to feel an urge to push. It is common for the body to naturally take a rest before feeling the urge to push, especially if you received an epidural or certain other pain medicines. This rest period may last for up to 1-2 hours, depending on your unique labor experience.  During stage 2, contractions are generally less painful, because pushing helps relieve contraction pain. Instead of contraction pain, you may feel stretching and burning pain, especially when the widest part of your baby's head passes through the vaginal opening (crowning).  Your health care provider will closely monitor your pushing progress and your baby's progress through the vagina during stage 2.  Your  health care provider may massage the area of skin between your vaginal opening and anus (perineum) or apply warm compresses to your perineum. This helps it stretch as the baby's head starts to crown, which can help prevent perineal tearing. ? In some cases, an incision may be made in your perineum (episiotomy) to allow the baby to pass through the vaginal opening. An episiotomy helps to make the opening of the vagina larger to allow more room for the baby to fit through.  It is very important to breathe and focus so your health care provider can control the delivery of your baby's head. Your health care provider may have you decrease the intensity of your pushing, to help prevent perineal tearing.  After delivery of your baby's head, the shoulders and the rest of the body generally deliver very quickly and without difficulty.  Once your baby is delivered, the umbilical cord may be cut right away, or this may be delayed for 1-2 minutes, depending on your baby's health. This may vary among health care providers, hospitals, and birth centers.  If you and your baby are healthy enough, your baby may be placed on your chest or abdomen to help maintain the baby's temperature and to help you bond with each other. Some mothers and babies start breastfeeding at this time. Your health care team will dry your baby and help keep your baby warm during this time.  Your baby may need immediate care if he or she: ? Showed signs of distress during labor. ? Has a medical condition. ? Was born too early (prematurely). ? Had a bowel movement before birth (meconium). ? Shows signs of difficulty transitioning from being inside the uterus to being outside of the uterus. If you are planning to breastfeed, your health care team will help you begin a feeding. Stage 3  The third stage of labor starts immediately after the birth of your baby and ends after you deliver the placenta. The placenta is an organ that develops  during pregnancy to provide oxygen and nutrients to your baby in the womb.  Delivering the placenta may require some pushing, and you may have mild contractions. Breastfeeding can stimulate contractions to help you deliver the placenta.  After the placenta is delivered, your uterus should tighten (contract) and become firm. This helps to stop bleeding in your uterus. To help your uterus contract and to control bleeding, your health care provider may: ? Give you medicine by injection, through an IV tube, by mouth, or through your rectum (rectally). ? Massage your abdomen or perform a vaginal exam to remove any blood clots that are left in your uterus. ? Empty your bladder by placing a thin, flexible tube (catheter) into your bladder. ? Encourage   you to breastfeed your baby. After labor is over, you and your baby will be monitored closely to ensure that you are both healthy until you are ready to go home. Your health care team will teach you how to care for yourself and your baby. This information is not intended to replace advice given to you by your health care provider. Make sure you discuss any questions you have with your health care provider. Document Released: 07/07/2008 Document Revised: 04/17/2016 Document Reviewed: 10/13/2015 Elsevier Interactive Patient Education  2018 [employer]  

## 2018-07-20 LAB — CULTURE, BETA STREP (GROUP B ONLY): Strep Gp B Culture: POSITIVE — AB

## 2018-07-21 ENCOUNTER — Other Ambulatory Visit: Payer: Self-pay

## 2018-07-21 ENCOUNTER — Inpatient Hospital Stay (HOSPITAL_COMMUNITY)
Admission: AD | Admit: 2018-07-21 | Discharge: 2018-07-21 | Disposition: A | Discharge status: Home / Self Care | Payer: Medicaid Other | Source: Ambulatory Visit | Attending: Obstetrics & Gynecology | Admitting: Obstetrics & Gynecology

## 2018-07-21 ENCOUNTER — Encounter (HOSPITAL_COMMUNITY): Payer: Self-pay | Admitting: *Deleted

## 2018-07-21 DIAGNOSIS — N898 Other specified noninflammatory disorders of vagina: Secondary | ICD-10-CM | POA: Insufficient documentation

## 2018-07-21 DIAGNOSIS — Z3A37 37 weeks gestation of pregnancy: Secondary | ICD-10-CM | POA: Diagnosis not present

## 2018-07-21 DIAGNOSIS — O26893 Other specified pregnancy related conditions, third trimester: Secondary | ICD-10-CM | POA: Insufficient documentation

## 2018-07-21 DIAGNOSIS — O2603 Excessive weight gain in pregnancy, third trimester: Secondary | ICD-10-CM

## 2018-07-21 DIAGNOSIS — R109 Unspecified abdominal pain: Secondary | ICD-10-CM

## 2018-07-21 DIAGNOSIS — Z3689 Encounter for other specified antenatal screening: Secondary | ICD-10-CM

## 2018-07-21 DIAGNOSIS — O0933 Supervision of pregnancy with insufficient antenatal care, third trimester: Secondary | ICD-10-CM

## 2018-07-21 DIAGNOSIS — Z87891 Personal history of nicotine dependence: Secondary | ICD-10-CM | POA: Diagnosis not present

## 2018-07-21 LAB — URINALYSIS, ROUTINE W REFLEX MICROSCOPIC
BILIRUBIN URINE: NEGATIVE
Glucose, UA: NEGATIVE mg/dL
HGB URINE DIPSTICK: NEGATIVE
Ketones, ur: NEGATIVE mg/dL
NITRITE: NEGATIVE
PH: 6 (ref 5.0–8.0)
Protein, ur: NEGATIVE mg/dL
Specific Gravity, Urine: 1.009 (ref 1.005–1.030)

## 2018-07-21 LAB — WET PREP, GENITAL
CLUE CELLS WET PREP: NONE SEEN
SPERM: NONE SEEN
Trich, Wet Prep: NONE SEEN
Yeast Wet Prep HPF POC: NONE SEEN

## 2018-07-21 LAB — POCT FERN TEST: POCT FERN TEST: NEGATIVE

## 2018-07-21 NOTE — MAU Provider Note (Signed)
History     CSN: [id number]  Arrival date and time: 07/21/18 1053   First Provider Initiated Contact with Patient 07/21/18 1146      Chief Complaint  Patient presents with  . Abdominal Pain  . Vaginal Discharge   G4P3003 @37 .3 wks here with cramping and vaginal discharge. Cramping has been ongoing "for a minute". Located bilateral in lower abdomen. Denies urinary sx. No VB. Vaginal discharge started yesterday. Denies gush of fluid. Discharge is thin and white and soaks her panty liners. No recent IC. Denies ctx. Reports good FM.    OB History    Gravida  4   Para  3   Term  3   Preterm      AB      Living  3     SAB      TAB      Ectopic      Multiple  0   Live Births  3           Past Medical History:  Diagnosis Date  . Chlamydia   . Gestational diabetes   . Trichomonas infection     Past Surgical History:  Procedure Laterality Date  . NO PAST SURGERIES      Family History  Problem Relation Age of Onset  . Diabetes Mother   . Stroke Paternal Grandmother   . Alcohol abuse Neg Hx   . Arthritis Neg Hx   . Asthma Neg Hx   . Birth defects Neg Hx   . Cancer Neg Hx   . COPD Neg Hx   . Depression Neg Hx   . Drug abuse Neg Hx   . Early death Neg Hx   . Hearing loss Neg Hx   . Heart disease Neg Hx   . Hyperlipidemia Neg Hx   . Hypertension Neg Hx   . Kidney disease Neg Hx   . Learning disabilities Neg Hx   . Mental illness Neg Hx   . Mental retardation Neg Hx   . Miscarriages / Stillbirths Neg Hx   . Vision loss Neg Hx     Social History   Tobacco Use  . Smoking status: Former Smoker    Packs/day: 0.25    Types: Cigarettes    Last attempt to quit: 11/18/2016    Years since quitting: 1.6  . Smokeless tobacco: Never Used  Substance Use Topics  . Alcohol use: No  . Drug use: No    Allergies: No Known Allergies  Medications Prior to Admission  Medication Sig Dispense Refill Last Dose  . Prenatal Vit-Fe Fumarate-FA (PRENATAL  COMPLETE) 14-0.4 MG TABS Take 1 tablet by mouth at bedtime. 60 each 4 Taking  . valACYclovir (VALTREX) 500 MG tablet Take 1 tablet (500 mg total) by mouth 2 (two) times daily. 60 tablet 6 Taking    Review of Systems  Constitutional: Negative for fever.  Gastrointestinal: Positive for abdominal pain.  Genitourinary: Positive for vaginal discharge. Negative for dysuria, hematuria and vaginal bleeding.   Physical Exam   Blood pressure 112/68, pulse (!) 135, temperature 98.9 F (37.2 C), temperature source Oral, resp. rate 20, height 5\' 2"  (1.575 m), weight 82 kg, last menstrual period 02/28/2018, SpO2 100 %, unknown if currently breastfeeding.  Physical Exam  Constitutional: She is oriented to person, place, and time. She appears well-developed and well-nourished. No distress.  HENT:  Head: Normocephalic and atraumatic.  Neck: Normal range of motion.  Respiratory: Effort normal. No respiratory distress.  GI:  Soft. She exhibits no distension. There is no tenderness.  gravid  Genitourinary:  Genitourinary Comments: SSE: no pool, fern neg, +creamy thick discharge SVE: closed/thick  Musculoskeletal: Normal range of motion.  Neurological: She is alert and oriented to person, place, and time.  Skin: Skin is warm and dry.  Psychiatric: She has a normal mood and affect.  EFM: 150 bpm, mod variability, + accels, no decels Toco: irregular, irritaility  Results for orders placed or performed during the hospital encounter of 07/21/18 (from the past 24 hour(s))  Wet prep, genital     Status: Abnormal   Collection Time: 07/21/18 12:02 PM  Result Value Ref Range   Yeast Wet Prep HPF POC NONE SEEN NONE SEEN   Trich, Wet Prep NONE SEEN NONE SEEN   Clue Cells Wet Prep HPF POC NONE SEEN NONE SEEN   WBC, Wet Prep HPF POC MANY (A) NONE SEEN   Sperm NONE SEEN   Urinalysis, Routine w reflex microscopic     Status: Abnormal   Collection Time: 07/21/18 12:04 PM  Result Value Ref Range   Color, Urine  YELLOW YELLOW   APPearance HAZY (A) CLEAR   Specific Gravity, Urine 1.009 1.005 - 1.030   pH 6.0 5.0 - 8.0   Glucose, UA NEGATIVE NEGATIVE mg/dL   Hgb urine dipstick NEGATIVE NEGATIVE   Bilirubin Urine NEGATIVE NEGATIVE   Ketones, ur NEGATIVE NEGATIVE mg/dL   Protein, ur NEGATIVE NEGATIVE mg/dL   Nitrite NEGATIVE NEGATIVE   Leukocytes, UA TRACE (A) NEGATIVE   RBC / HPF 0-5 0 - 5 RBC/hpf   WBC, UA 6-10 0 - 5 WBC/hpf   Bacteria, UA MANY (A) NONE SEEN   Squamous Epithelial / LPF 6-10 0 - 5   Mucus PRESENT   Fern Test     Status: Normal   Collection Time: 07/21/18 12:34 PM  Result Value Ref Range   POCT Fern Test Negative = intact amniotic membranes    MAU Course  Procedures  MDM Labs ordered and reviewed. No evidence of SROM or labor. UA with many bacteria and WBCs, will send UC. Stable for discharge home.  Assessment and Plan  [redacted] weeks gestation NST reactive Vaginal discharge in pregnancy  Discharge home Follow up at [facility] as scheduled Labor precautions FMCs  Allergies as of 07/21/2018   No Known Allergies     Medication List    TAKE these medications   PRENATAL COMPLETE 14-0.4 MG Tabs Take 1 tablet by mouth at bedtime.   valACYclovir 500 MG tablet Commonly known as:  VALTREX Take 1 tablet (500 mg total) by mouth 2 (two) times daily.      Donette Larry, CNM 07/21/2018, 12:49 PM

## 2018-07-21 NOTE — Discharge Instructions (Signed)

## 2018-07-21 NOTE — MAU Note (Signed)
Already been cramping, are back to back; getting worse.  Denies bleeding or water leaking. Is discharging real bad.

## 2018-07-22 LAB — CULTURE, OB URINE

## 2018-07-25 ENCOUNTER — Ambulatory Visit (INDEPENDENT_AMBULATORY_CARE_PROVIDER_SITE_OTHER): Payer: Medicaid Other | Admitting: Obstetrics and Gynecology

## 2018-07-25 ENCOUNTER — Encounter: Payer: Self-pay | Admitting: Obstetrics and Gynecology

## 2018-07-25 VITALS — BP 128/75 | HR 82 | Wt 183.4 lb

## 2018-07-25 DIAGNOSIS — Z3480 Encounter for supervision of other normal pregnancy, unspecified trimester: Secondary | ICD-10-CM

## 2018-07-25 DIAGNOSIS — A609 Anogenital herpesviral infection, unspecified: Secondary | ICD-10-CM

## 2018-07-25 DIAGNOSIS — O99820 Streptococcus B carrier state complicating pregnancy: Secondary | ICD-10-CM

## 2018-07-25 DIAGNOSIS — O09299 Supervision of pregnancy with other poor reproductive or obstetric history, unspecified trimester: Secondary | ICD-10-CM

## 2018-07-25 DIAGNOSIS — Z8632 Personal history of gestational diabetes: Secondary | ICD-10-CM

## 2018-07-25 NOTE — Progress Notes (Signed)
Subjective:  Shelly Tate is a 27 y.o. [phone] at [redacted]w[redacted]d being seen today for ongoing prenatal care.  She is currently monitored for the following issues for this high-risk pregnancy and has History of gestational diabetes in prior pregnancy, currently pregnant; Hx of preeclampsia, prior pregnancy, currently pregnant; Late prenatal care affecting pregnancy; Supervision of other normal pregnancy, antepartum; HSV (herpes simplex virus) anogenital infection; Excessive weight gain during pregnancy in third trimester; and GBS (group B Streptococcus carrier), +RV culture, currently pregnant on their problem list.  Patient reports general discomforts of pregnancy.  Contractions: Irregular. Vag. Bleeding: None.  Movement: Present. Denies leaking of fluid.   The following portions of the patient's history were reviewed and updated as appropriate: allergies, current medications, past family history, past medical history, past social history, past surgical history and problem list. Problem list updated.  Objective:   Vitals:   07/25/18 0827  BP: 128/75  Pulse: 82  Weight: 183 lb 6.4 oz (83.2 kg)    Fetal Status: Fetal Heart Rate (bpm): 141   Movement: Present     General:  Alert, oriented and cooperative. Patient is in no acute distress.  Skin: Skin is warm and dry. No rash noted.   Cardiovascular: Normal heart rate noted  Respiratory: Normal respiratory effort, no problems with respiration noted  Abdomen: Soft, gravid, appropriate for gestational age. Pain/Pressure: Present     Pelvic:  Cervical exam performed        Extremities: Normal range of motion.  Edema: Mild pitting, slight indentation  Mental Status: Normal mood and affect. Normal behavior. Normal judgment and thought content.   Urinalysis:      Assessment and Plan:  Pregnancy: G4P3003 at [redacted]w[redacted]d  1. Supervision of other normal pregnancy, antepartum Stable Labor precautions  2. Hx of preeclampsia, prior pregnancy, currently  pregnant BP stable No S/Sx presently  3. History of gestational diabetes in prior pregnancy, currently pregnant Passed 2 hr  4. HSV (herpes simplex virus) anogenital infection Continue with suppression Tx  5. GBS (group B Streptococcus carrier), +RV culture, currently pregnant Tx while in labor  Term labor symptoms and general obstetric precautions including but not limited to vaginal bleeding, contractions, leaking of fluid and fetal movement were reviewed in detail with the patient. Please refer to After Visit Summary for other counseling recommendations.  Return in about 1 week (around 08/01/2018) for OB visit.   Hermina Staggers, MD

## 2018-07-25 NOTE — Patient Instructions (Signed)
Vaginal Delivery Vaginal delivery means that you will give birth by pushing your baby out of your birth canal (vagina). A team of health care providers will help you before, during, and after vaginal delivery. Birth experiences are unique for every woman and every pregnancy, and birth experiences vary depending on where you choose to give birth. What should I do to prepare for my baby's birth? Before your baby is born, it is important to talk with your health care provider about:  Your labor and delivery preferences. These may include: ? Medicines that you may be given. ? How you will manage your pain. This might include non-medical pain relief techniques or injectable pain relief such as epidural analgesia. ? How you and your baby will be monitored during labor and delivery. ? Who may be in the labor and delivery room with you. ? Your feelings about surgical delivery of your baby (cesarean delivery, or C-section) if this becomes necessary. ? Your feelings about receiving donated blood through an IV tube (blood transfusion) if this becomes necessary.  Whether you are able: ? To take pictures or videos of the birth. ? To eat during labor and delivery. ? To move around, walk, or change positions during labor and delivery.  What to expect after your baby is born, such as: ? Whether delayed umbilical cord clamping and cutting is offered. ? Who will care for your baby right after birth. ? Medicines or tests that may be recommended for your baby. ? Whether breastfeeding is supported in your hospital or birth center. ? How long you will be in the hospital or birth center.  How any medical conditions you have may affect your baby or your labor and delivery experience.  To prepare for your baby's birth, you should also:  Attend all of your health care visits before delivery (prenatal visits) as recommended by your health care provider. This is important.  Prepare your home for your baby's  arrival. Make sure that you have: ? Diapers. ? Baby clothing. ? Feeding equipment. ? Safe sleeping arrangements for you and your baby.  Install a car seat in your vehicle. Have your car seat checked by a certified car seat installer to make sure that it is installed safely.  Think about who will help you with your new baby at home for at least the first several weeks after delivery.  What can I expect when I arrive at the birth center or hospital? Once you are in labor and have been admitted into the hospital or birth center, your health care provider may:  Review your pregnancy history and any concerns you have.  Insert an IV tube into one of your veins. This is used to give you fluids and medicines.  Check your blood pressure, pulse, temperature, and heart rate (vital signs).  Check whether your bag of water (amniotic sac) has broken (ruptured).  Talk with you about your birth plan and discuss pain control options.  Monitoring Your health care provider may monitor your contractions (uterine monitoring) and your baby's heart rate (fetal monitoring). You may need to be monitored:  Often, but not continuously (intermittently).  All the time or for long periods at a time (continuously). Continuous monitoring may be needed if: ? You are taking certain medicines, such as medicine to relieve pain or make your contractions stronger. ? You have pregnancy or labor complications.  Monitoring may be done by:  Placing a special stethoscope or a handheld monitoring device on your abdomen to   check your baby's heartbeat, and feeling your abdomen for contractions. This method of monitoring does not continuously record your baby's heartbeat or your contractions.  Placing monitors on your abdomen (external monitors) to record your baby's heartbeat and the frequency and length of contractions. You may not have to wear external monitors all the time.  Placing monitors inside of your uterus  (internal monitors) to record your baby's heartbeat and the frequency, length, and strength of your contractions. ? Your health care provider may use internal monitors if he or she needs more information about the strength of your contractions or your baby's heart rate. ? Internal monitors are put in place by passing a thin, flexible wire through your vagina and into your uterus. Depending on the type of monitor, it may remain in your uterus or on your baby's head until birth. ? Your health care provider will discuss the benefits and risks of internal monitoring with you and will ask for your permission before inserting the monitors.  Telemetry. This is a type of continuous monitoring that can be done with external or internal monitors. Instead of having to stay in bed, you are able to move around during telemetry. Ask your health care provider if telemetry is an option for you.  Physical exam Your health care provider may perform a physical exam. This may include:  Checking whether your baby is positioned: ? With the head toward your vagina (head-down). This is most common. ? With the head toward the top of your uterus (head-up or breech). If your baby is in a breech position, your health care provider may try to turn your baby to a head-down position so you can deliver vaginally. If it does not seem that your baby can be born vaginally, your provider may recommend surgery to deliver your baby. In rare cases, you may be able to deliver vaginally if your baby is head-up (breech delivery). ? Lying sideways (transverse). Babies that are lying sideways cannot be delivered vaginally.  Checking your cervix to determine: ? Whether it is thinning out (effacing). ? Whether it is opening up (dilating). ? How low your baby has moved into your birth canal.  What are the three stages of labor and delivery?  Normal labor and delivery is divided into the following three stages: Stage 1  Stage 1 is the  longest stage of labor, and it can last for hours or days. Stage 1 includes: ? Early labor. This is when contractions may be irregular, or regular and mild. Generally, early labor contractions are more than 10 minutes apart. ? Active labor. This is when contractions get longer, more regular, more frequent, and more intense. ? The transition phase. This is when contractions happen very close together, are very intense, and may last longer than during any other part of labor.  Contractions generally feel mild, infrequent, and irregular at first. They get stronger, more frequent (about every 2-3 minutes), and more regular as you progress from early labor through active labor and transition.  Many women progress through stage 1 naturally, but you may need help to continue making progress. If this happens, your health care provider may talk with you about: ? Rupturing your amniotic sac if it has not ruptured yet. ? Giving you medicine to help make your contractions stronger and more frequent.  Stage 1 ends when your cervix is completely dilated to 4 inches (10 cm) and completely effaced. This happens at the end of the transition phase. Stage 2  Once   your cervix is completely effaced and dilated to 4 inches (10 cm), you may start to feel an urge to push. It is common for the body to naturally take a rest before feeling the urge to push, especially if you received an epidural or certain other pain medicines. This rest period may last for up to 1-2 hours, depending on your unique labor experience.  During stage 2, contractions are generally less painful, because pushing helps relieve contraction pain. Instead of contraction pain, you may feel stretching and burning pain, especially when the widest part of your baby's head passes through the vaginal opening (crowning).  Your health care provider will closely monitor your pushing progress and your baby's progress through the vagina during stage 2.  Your  health care provider may massage the area of skin between your vaginal opening and anus (perineum) or apply warm compresses to your perineum. This helps it stretch as the baby's head starts to crown, which can help prevent perineal tearing. ? In some cases, an incision may be made in your perineum (episiotomy) to allow the baby to pass through the vaginal opening. An episiotomy helps to make the opening of the vagina larger to allow more room for the baby to fit through.  It is very important to breathe and focus so your health care provider can control the delivery of your baby's head. Your health care provider may have you decrease the intensity of your pushing, to help prevent perineal tearing.  After delivery of your baby's head, the shoulders and the rest of the body generally deliver very quickly and without difficulty.  Once your baby is delivered, the umbilical cord may be cut right away, or this may be delayed for 1-2 minutes, depending on your baby's health. This may vary among health care providers, hospitals, and birth centers.  If you and your baby are healthy enough, your baby may be placed on your chest or abdomen to help maintain the baby's temperature and to help you bond with each other. Some mothers and babies start breastfeeding at this time. Your health care team will dry your baby and help keep your baby warm during this time.  Your baby may need immediate care if he or she: ? Showed signs of distress during labor. ? Has a medical condition. ? Was born too early (prematurely). ? Had a bowel movement before birth (meconium). ? Shows signs of difficulty transitioning from being inside the uterus to being outside of the uterus. If you are planning to breastfeed, your health care team will help you begin a feeding. Stage 3  The third stage of labor starts immediately after the birth of your baby and ends after you deliver the placenta. The placenta is an organ that develops  during pregnancy to provide oxygen and nutrients to your baby in the womb.  Delivering the placenta may require some pushing, and you may have mild contractions. Breastfeeding can stimulate contractions to help you deliver the placenta.  After the placenta is delivered, your uterus should tighten (contract) and become firm. This helps to stop bleeding in your uterus. To help your uterus contract and to control bleeding, your health care provider may: ? Give you medicine by injection, through an IV tube, by mouth, or through your rectum (rectally). ? Massage your abdomen or perform a vaginal exam to remove any blood clots that are left in your uterus. ? Empty your bladder by placing a thin, flexible tube (catheter) into your bladder. ? Encourage   you to breastfeed your baby. After labor is over, you and your baby will be monitored closely to ensure that you are both healthy until you are ready to go home. Your health care team will teach you how to care for yourself and your baby. This information is not intended to replace advice given to you by your health care provider. Make sure you discuss any questions you have with your health care provider. Document Released: 07/07/2008 Document Revised: 04/17/2016 Document Reviewed: 10/13/2015 Elsevier Interactive Patient Education  2018 [employer]  

## 2018-07-28 ENCOUNTER — Inpatient Hospital Stay (HOSPITAL_COMMUNITY)
Admission: AD | Admit: 2018-07-28 | Discharge: 2018-07-30 | DRG: 806 | Disposition: A | Discharge status: Home / Self Care | Payer: Medicaid Other | Attending: Family Medicine | Admitting: Family Medicine

## 2018-07-28 ENCOUNTER — Inpatient Hospital Stay (HOSPITAL_COMMUNITY): Payer: Medicaid Other | Admitting: Anesthesiology

## 2018-07-28 ENCOUNTER — Encounter (HOSPITAL_COMMUNITY): Payer: Self-pay | Admitting: *Deleted

## 2018-07-28 ENCOUNTER — Encounter (HOSPITAL_COMMUNITY): Payer: Self-pay

## 2018-07-28 ENCOUNTER — Other Ambulatory Visit: Payer: Self-pay

## 2018-07-28 DIAGNOSIS — O9832 Other infections with a predominantly sexual mode of transmission complicating childbirth: Secondary | ICD-10-CM | POA: Diagnosis present

## 2018-07-28 DIAGNOSIS — Z8632 Personal history of gestational diabetes: Secondary | ICD-10-CM

## 2018-07-28 DIAGNOSIS — O2603 Excessive weight gain in pregnancy, third trimester: Secondary | ICD-10-CM | POA: Diagnosis present

## 2018-07-28 DIAGNOSIS — O09299 Supervision of pregnancy with other poor reproductive or obstetric history, unspecified trimester: Secondary | ICD-10-CM

## 2018-07-28 DIAGNOSIS — Z3A38 38 weeks gestation of pregnancy: Secondary | ICD-10-CM

## 2018-07-28 DIAGNOSIS — Z87891 Personal history of nicotine dependence: Secondary | ICD-10-CM

## 2018-07-28 DIAGNOSIS — O99824 Streptococcus B carrier state complicating childbirth: Secondary | ICD-10-CM | POA: Diagnosis present

## 2018-07-28 DIAGNOSIS — A6000 Herpesviral infection of urogenital system, unspecified: Secondary | ICD-10-CM | POA: Diagnosis present

## 2018-07-28 DIAGNOSIS — O99820 Streptococcus B carrier state complicating pregnancy: Secondary | ICD-10-CM

## 2018-07-28 DIAGNOSIS — A609 Anogenital herpesviral infection, unspecified: Secondary | ICD-10-CM | POA: Diagnosis present

## 2018-07-28 DIAGNOSIS — Z3483 Encounter for supervision of other normal pregnancy, third trimester: Secondary | ICD-10-CM | POA: Diagnosis present

## 2018-07-28 DIAGNOSIS — O0930 Supervision of pregnancy with insufficient antenatal care, unspecified trimester: Secondary | ICD-10-CM

## 2018-07-28 LAB — COMPREHENSIVE METABOLIC PANEL
ALK PHOS: 149 U/L — AB (ref 38–126)
ALT: 12 U/L (ref 0–44)
ANION GAP: 9 (ref 5–15)
AST: 21 U/L (ref 15–41)
Albumin: 2.8 g/dL — ABNORMAL LOW (ref 3.5–5.0)
BILIRUBIN TOTAL: 0.4 mg/dL (ref 0.3–1.2)
BUN: 9 mg/dL (ref 6–20)
CALCIUM: 8.9 mg/dL (ref 8.9–10.3)
CO2: 22 mmol/L (ref 22–32)
CREATININE: 0.87 mg/dL (ref 0.44–1.00)
Chloride: 105 mmol/L (ref 98–111)
Glucose, Bld: 85 mg/dL (ref 70–99)
Potassium: 3.4 mmol/L — ABNORMAL LOW (ref 3.5–5.1)
SODIUM: 136 mmol/L (ref 135–145)
TOTAL PROTEIN: 7.4 g/dL (ref 6.5–8.1)

## 2018-07-28 LAB — CBC
HCT: 37.3 % (ref 36.0–46.0)
Hemoglobin: 12.1 g/dL (ref 12.0–15.0)
MCH: 26.1 pg (ref 26.0–34.0)
MCHC: 32.4 g/dL (ref 30.0–36.0)
MCV: 80.6 fL (ref 80.0–100.0)
NRBC: 0 % (ref 0.0–0.2)
PLATELETS: 266 10*3/uL (ref 150–400)
RBC: 4.63 MIL/uL (ref 3.87–5.11)
RDW: 15.1 % (ref 11.5–15.5)
WBC: 10.1 10*3/uL (ref 4.0–10.5)

## 2018-07-28 LAB — PROTEIN / CREATININE RATIO, URINE
CREATININE, URINE: 63 mg/dL
Protein Creatinine Ratio: 0.22 mg/mg{Cre} — ABNORMAL HIGH (ref 0.00–0.15)
TOTAL PROTEIN, URINE: 14 mg/dL

## 2018-07-28 LAB — TYPE AND SCREEN
ABO/RH(D): B POS
ANTIBODY SCREEN: NEGATIVE

## 2018-07-28 MED ORDER — COCONUT OIL OIL: 1.0000 "application " | TOPICAL_OIL | Status: DC | PRN

## 2018-07-28 MED ORDER — PENICILLIN G POTASSIUM 5000000 UNITS IJ SOLR: 2.5000 10*6.[IU] | INTRAVENOUS | Status: DC

## 2018-07-28 MED ORDER — PHENYLEPHRINE 40 MCG/ML (10ML) SYRINGE FOR IV PUSH (FOR BLOOD PRESSURE SUPPORT)
80.0000 ug | PREFILLED_SYRINGE | INTRAVENOUS | Status: DC | PRN
  Filled 2018-07-28: qty 5
  Filled 2018-07-28: qty 10

## 2018-07-28 MED ORDER — OXYTOCIN BOLUS FROM INFUSION
500.0000 mL | Freq: Once | INTRAVENOUS | Status: AC
  Administered 2018-07-28: 500 mL via INTRAVENOUS

## 2018-07-28 MED ORDER — EPHEDRINE 5 MG/ML INJ
10.0000 mg | INTRAVENOUS | Status: DC | PRN
  Filled 2018-07-28: qty 2

## 2018-07-28 MED ORDER — OXYCODONE-ACETAMINOPHEN 5-325 MG PO TABS: 2.0000 | ORAL_TABLET | ORAL | Status: DC | PRN

## 2018-07-28 MED ORDER — PENICILLIN G 3 MILLION UNITS IVPB - SIMPLE MED: 3.0000 10*6.[IU] | INTRAVENOUS | Status: DC

## 2018-07-28 MED ORDER — ACETAMINOPHEN 325 MG PO TABS
650.0000 mg | ORAL_TABLET | ORAL | Status: DC | PRN
  Administered 2018-07-28: 650 mg via ORAL

## 2018-07-28 MED ORDER — OXYTOCIN 40 UNITS IN LACTATED RINGERS INFUSION - SIMPLE MED
2.5000 [IU]/h | INTRAVENOUS | Status: DC
  Administered 2018-07-28: 2.5 [IU]/h via INTRAVENOUS
  Filled 2018-07-28: qty 1000

## 2018-07-28 MED ORDER — LACTATED RINGERS IV SOLN: 500.0000 mL | INTRAVENOUS | Status: DC | PRN

## 2018-07-28 MED ORDER — PRENATAL MULTIVITAMIN CH
1.0000 | ORAL_TABLET | Freq: Every day | ORAL | Status: DC
  Administered 2018-07-29 – 2018-07-30 (×2): 1 via ORAL
  Filled 2018-07-28 (×2): qty 1

## 2018-07-28 MED ORDER — TETANUS-DIPHTH-ACELL PERTUSSIS 5-2.5-18.5 LF-MCG/0.5 IM SUSP: 0.5000 mL | Freq: Once | INTRAMUSCULAR | Status: DC

## 2018-07-28 MED ORDER — SODIUM CHLORIDE 0.9 % IV SOLN
5.0000 10*6.[IU] | Freq: Once | INTRAVENOUS | Status: AC
  Administered 2018-07-28: 5 10*6.[IU] via INTRAVENOUS
  Filled 2018-07-28: qty 5

## 2018-07-28 MED ORDER — ZOLPIDEM TARTRATE 5 MG PO TABS: 5.0000 mg | ORAL_TABLET | Freq: Every evening | ORAL | Status: DC | PRN

## 2018-07-28 MED ORDER — ACETAMINOPHEN 325 MG PO TABS
650.0000 mg | ORAL_TABLET | ORAL | Status: DC | PRN
  Filled 2018-07-28: qty 2

## 2018-07-28 MED ORDER — DIBUCAINE 1 % RE OINT: 1.0000 "application " | TOPICAL_OINTMENT | RECTAL | Status: DC | PRN

## 2018-07-28 MED ORDER — ONDANSETRON HCL 4 MG/2ML IJ SOLN: 4.0000 mg | Freq: Four times a day (QID) | INTRAMUSCULAR | Status: DC | PRN

## 2018-07-28 MED ORDER — SENNOSIDES-DOCUSATE SODIUM 8.6-50 MG PO TABS
2.0000 | ORAL_TABLET | ORAL | Status: DC
  Administered 2018-07-29: 2 via ORAL
  Filled 2018-07-28 (×2): qty 2

## 2018-07-28 MED ORDER — IBUPROFEN 600 MG PO TABS
600.0000 mg | ORAL_TABLET | Freq: Four times a day (QID) | ORAL | Status: DC
  Administered 2018-07-28 – 2018-07-30 (×8): 600 mg via ORAL
  Filled 2018-07-28 (×8): qty 1

## 2018-07-28 MED ORDER — LACTATED RINGERS IV SOLN: 500.0000 mL | Freq: Once | INTRAVENOUS | Status: DC

## 2018-07-28 MED ORDER — ONDANSETRON HCL 4 MG PO TABS: 4.0000 mg | ORAL_TABLET | ORAL | Status: DC | PRN

## 2018-07-28 MED ORDER — OXYCODONE-ACETAMINOPHEN 5-325 MG PO TABS: 1.0000 | ORAL_TABLET | ORAL | Status: DC | PRN

## 2018-07-28 MED ORDER — FENTANYL 2.5 MCG/ML BUPIVACAINE 1/10 % EPIDURAL INFUSION (WH - ANES)
14.0000 mL/h | INTRAMUSCULAR | Status: DC | PRN
  Administered 2018-07-28: 13 mL/h via EPIDURAL
  Filled 2018-07-28: qty 100

## 2018-07-28 MED ORDER — DIPHENHYDRAMINE HCL 50 MG/ML IJ SOLN: 12.5000 mg | INTRAMUSCULAR | Status: DC | PRN

## 2018-07-28 MED ORDER — LACTATED RINGERS IV SOLN
INTRAVENOUS | Status: DC
  Administered 2018-07-28: 12:00:00 via INTRAVENOUS

## 2018-07-28 MED ORDER — WITCH HAZEL-GLYCERIN EX PADS: 1.0000 "application " | MEDICATED_PAD | CUTANEOUS | Status: DC | PRN

## 2018-07-28 MED ORDER — LACTATED RINGERS IV SOLN
500.0000 mL | Freq: Once | INTRAVENOUS | Status: AC
  Administered 2018-07-28: 500 mL via INTRAVENOUS

## 2018-07-28 MED ORDER — ONDANSETRON HCL 4 MG/2ML IJ SOLN: 4.0000 mg | INTRAMUSCULAR | Status: DC | PRN

## 2018-07-28 MED ORDER — DIPHENHYDRAMINE HCL 25 MG PO CAPS: 25.0000 mg | ORAL_CAPSULE | Freq: Four times a day (QID) | ORAL | Status: DC | PRN

## 2018-07-28 MED ORDER — SOD CITRATE-CITRIC ACID 500-334 MG/5ML PO SOLN: 30.0000 mL | ORAL | Status: DC | PRN

## 2018-07-28 MED ORDER — SIMETHICONE 80 MG PO CHEW: 80.0000 mg | CHEWABLE_TABLET | ORAL | Status: DC | PRN

## 2018-07-28 MED ORDER — PHENYLEPHRINE 40 MCG/ML (10ML) SYRINGE FOR IV PUSH (FOR BLOOD PRESSURE SUPPORT)
80.0000 ug | PREFILLED_SYRINGE | INTRAVENOUS | Status: DC | PRN
  Filled 2018-07-28: qty 5

## 2018-07-28 MED ORDER — LIDOCAINE HCL (PF) 1 % IJ SOLN
30.0000 mL | INTRAMUSCULAR | Status: DC | PRN
  Filled 2018-07-28: qty 30

## 2018-07-28 MED ORDER — BENZOCAINE-MENTHOL 20-0.5 % EX AERO: 1.0000 "application " | INHALATION_SPRAY | CUTANEOUS | Status: DC | PRN

## 2018-07-28 MED ORDER — LIDOCAINE HCL (PF) 1 % IJ SOLN
INTRAMUSCULAR | Status: DC | PRN
  Administered 2018-07-28 (×2): 4 mL via EPIDURAL

## 2018-07-28 NOTE — Anesthesia Preprocedure Evaluation (Signed)
Anesthesia Evaluation  Patient identified by MRN, date of birth, ID band Patient awake    Reviewed: Allergy & Precautions, Patient's Chart, lab work & pertinent test results  Airway Mallampati: II  TM Distance: >3 FB Neck ROM: Full    Dental no notable dental hx. (+) Teeth Intact   Pulmonary former smoker,    Pulmonary exam normal breath sounds clear to auscultation       Cardiovascular negative cardio ROS Normal cardiovascular exam Rhythm:Regular Rate:Normal     Neuro/Psych negative neurological ROS  negative psych ROS   GI/Hepatic negative GI ROS, Neg liver ROS,   Endo/Other  diabetes, Gestational  Renal/GU negative Renal ROS  negative genitourinary   Musculoskeletal negative musculoskeletal ROS (+)   Abdominal   Peds negative pediatric ROS (+)  Hematology negative hematology ROS (+)   Anesthesia Other Findings   Reproductive/Obstetrics (+) Pregnancy                             Anesthesia Physical Anesthesia Plan  ASA: II  Anesthesia Plan: Epidural   Post-op Pain Management:    Induction:   PONV Risk Score and Plan: 2 and Treatment may vary due to age or medical condition  Airway Management Planned: Natural Airway  Additional Equipment:   Intra-op Plan:   Post-operative Plan:   Informed Consent: I have reviewed the patients History and Physical, chart, labs and discussed the procedure including the risks, benefits and alternatives for the proposed anesthesia with the patient or authorized representative who has indicated his/her understanding and acceptance.     Plan Discussed with: CRNA  Anesthesia Plan Comments:         Anesthesia Quick Evaluation  

## 2018-07-28 NOTE — MAU Note (Signed)
Pt reports contractions, denies bleeding or ROM.  

## 2018-07-28 NOTE — Progress Notes (Addendum)
   Shelly Tate is a 27 y.o. [phone] at [redacted]w[redacted]d  admitted for active labor  Subjective: Coping well; feeling some urge to push. Denies HA, blurry vision, RUQ.   Objective: Vitals:   07/28/18 1324 07/28/18 1326 07/28/18 1330 07/28/18 1345  BP: (!) 149/91 (!) 144/100 (!) 146/104 131/86  Pulse: 89 96 (!) 110 97  Resp: 20 20    Temp:      TempSrc:      SpO2:      Weight:      Height:       No intake/output data recorded.  FHT:  FHR: 135 bpm, variability: moderate,  accelerations:  Present,  decelerations:  Absent UC:   q 2-3 SVE:   Dilation: 10 Effacement (%): 100 Station: Plus 1 Exam by:: kathryn cnm   Labs: Lab Results  Component Value Date   WBC 10.1 07/28/2018   HGB 12.1 07/28/2018   HCT 37.3 07/28/2018   MCV 80.6 07/28/2018   PLT 266 07/28/2018    Assessment / Plan: Patient 10 cm with bulging bag; AROM moderate amoutn of clear fluid. Reposition for delivery.   Patient now with elevated BPs; will draw CMNP and PCR and continue to monitor. BPs do not meet criteria for severe range nor are BPs elevated on 2 occasions 4 hours apart.   Patient Vitals for the past 24 hrs:  BP Temp Temp src Pulse Resp SpO2 Height Weight  07/28/18 1433 127/86 - - - - - - -  07/28/18 1425 114/63 - - (!) 101 - - - -  07/28/18 1415 130/74 - - (!) 103 - - - -  07/28/18 1345 131/86 - - 97 - - - -  07/28/18 1330 (!) 146/104 - - (!) 110 - - - -  07/28/18 1326 (!) 144/100 - - 96 20 - - -  07/28/18 1324 (!) 149/91 - - 89 20 - - -  07/28/18 1300 (!) 141/79 - - 98 - - - -  07/28/18 1253 (!) 142/89 - - (!) 107 - - - -  07/28/18 1126 126/70 97.7 F (36.5 C) Oral (!) 107 20 - 5\' 2"  (1.575 m) 83 kg  07/28/18 1110 (!) 142/84 - - (!) 118 - - - -  07/28/18 1007 138/86 97.7 F (36.5 C) Oral 97 16 100 % 5\' 2"  (1.575 m) 83 kg    Labor: Progressing normally Fetal Wellbeing:  Category I Pain Control:  Epidural Anticipated MOD:  NSVD  Charlesetta Garibaldi Kooistra 07/28/2018, 2:18 PM

## 2018-07-28 NOTE — Lactation Note (Signed)
This note was copied from a baby's chart. Lactation Consultation Note  Patient Name: Shelly Tate [id number] Date: 07/28/2018 Reason for consult: Initial assessment;Early term 16-38.6wks  5 hours old early term baby who is being exclusively BF by her mother, she's a P4 and experienced BF; she was able to BF her other kids for 3 months. She did voiced she was also going to supplement with formula at some point. Mom participated in the [facility] program at the [facility] and already knows how to hand express, able to see some colostrum when doing hand expression with her RN. Reviewed prevention and treatment of sore nipples. Mom doesn't have a pump at home, [facility] offered one from the hospital, pump instructions, cleaning and storage were reviewed as well as milk storage guidelines.  Mom trying to nurse baby when entering the room with RN Bre, she was training with lactation; offered assistance with latch and mom agreed to have baby STS, LC removed blankets and clothes and took baby to mom's right breast in cross cradle position. Baby able to latch easily after blankets/clothes were removed with a big wide open mouth and flanged lips; a few audible swallows were heard. Lab tech came to do lab work while baby was nursing and she lost the latch when she started crying; at the 10 minutes mark. Discussed benefits of BF over formula feeding, LC recommended mom to ideally wait 3-4 weeks before supplementing until BF is well established.  Feeding plan:  1. Encouraged mom to feed baby STS 8-12 times/24 hours or sooner if feeding cues are present 2. Hand expression and spoon feeding was also encouraged  BF brochure, BF resources and feeding diary were reviewed, mom reported all questions and concerns were answered, she's aware of LC services and will call PRN.  Maternal Data Formula Feeding for Exclusion: Yes Reason for exclusion: Mother's choice to formula and breast feed on admission Has patient been taught Hand  Expression?: Yes Does the patient have breastfeeding experience prior to this delivery?: Yes  Feeding Feeding Type: Breast Fed  LATCH Score Latch: Grasps breast easily, tongue down, lips flanged, rhythmical sucking.  Audible Swallowing: A few with stimulation  Type of Nipple: Everted at rest and after stimulation(short shafted nipples)  Comfort (Breast/Nipple): Soft / non-tender  Hold (Positioning): Assistance needed to correctly position infant at breast and maintain latch.  LATCH Score: 8  Interventions Interventions: Breast feeding basics reviewed;Assisted with latch;Skin to skin;Breast compression;Adjust position;Support pillows;Hand pump;Hand express  Lactation Tools Discussed/Used Tools: Pump Breast pump type: Manual WIC Program: Yes Pump Review: Setup, frequency, and cleaning;Milk Storage Initiated by:: MPeck Date initiated:: 07/28/18   Consult Status Consult Status: Follow-up Date: 07/29/18 Follow-up type: In-patient    Shelly Tate [name] 07/28/2018, 8:03 PM

## 2018-07-28 NOTE — Anesthesia Procedure Notes (Signed)
Epidural Patient location during procedure: OB Start time: 07/28/2018 12:48 PM End time: 07/28/2018 12:50 PM  Staffing Anesthesiologist: Kaylyn Layer, MD Performed: anesthesiologist   Preanesthetic Checklist Completed: patient identified, pre-op evaluation, timeout performed, IV checked, risks and benefits discussed and monitors and equipment checked  Epidural Patient position: sitting Prep: site prepped and draped and DuraPrep Patient monitoring: continuous pulse ox, blood pressure, heart rate and cardiac monitor Approach: midline Location: L3-L4 Injection technique: LOR air  Needle:  Needle type: Tuohy  Needle gauge: 17 G Needle length: 9 cm Needle insertion depth: 6 cm Catheter type: closed end flexible Catheter size: 19 Gauge Catheter at skin depth: 11 cm Test dose: negative and Other (1% lidocaine)  Assessment Events: blood not aspirated, injection not painful, no injection resistance, negative IV test and no paresthesia  Additional Notes Patient identified. Risks, benefits, and alternatives discussed with patient including but not limited to bleeding, infection, nerve damage, paralysis, failed block, incomplete pain control, headache, blood pressure changes, nausea, vomiting, reactions to medication, itching, and postpartum back pain. Confirmed with bedside nurse the patient's most recent platelet count. Confirmed with patient that they are not currently taking any anticoagulation, have any bleeding history, or any family history of bleeding disorders. Patient expressed understanding and wished to proceed. All questions were answered. Sterile technique was used throughout the entire procedure. Please see nursing notes for vital signs. Crisp LOR on first pass. Test dose was given through epidural catheter and negative prior to continuing to dose epidural or start infusion. Warning signs of high block given to the patient including shortness of breath, tingling/numbness in  hands, complete motor block, or any concerning symptoms with instructions to call for help. Patient was given instructions on fall risk and not to get out of bed. All questions and concerns addressed with instructions to call with any issues or inadequate analgesia.  Reason for block:procedure for pain

## 2018-07-28 NOTE — H&P (Addendum)
Shelly Tate is a 27 y.o. female [phone] with IUP at [redacted]w[redacted]d presenting for SOL. Pt states she has been having irregular, every 3-4 minutes since 7 am minutes contractions, associated with none vaginal bleeding for no hours..  Membranes are intact, with active fetal movement.   [facility] 23 wks  Prenatal History/Complications:  Past Medical History: Past Medical History:  Diagnosis Date  . Chlamydia   . Gestational diabetes   . Trichomonas infection     Past Surgical History: Past Surgical History:  Procedure Laterality Date  . NO PAST SURGERIES      Obstetrical History: OB History    Gravida  4   Para  3   Term  3   Preterm      AB      Living  3     SAB      TAB      Ectopic      Multiple  0   Live Births  3            Social History: Social History   Socioeconomic History  . Marital status: Single    Spouse name: Not on file  . Number of children: Not on file  . Years of education: Not on file  . Highest education level: Not on file  Occupational History  . Not on file  Social Needs  . Financial resource strain: Not on file  . Food insecurity:    Worry: Not on file    Inability: Not on file  . Transportation needs:    Medical: Not on file    Non-medical: Not on file  Tobacco Use  . Smoking status: Former Smoker    Packs/day: 0.25    Types: Cigarettes    Last attempt to quit: 11/18/2016    Years since quitting: 1.6  . Smokeless tobacco: Never Used  Substance and Sexual Activity  . Alcohol use: No  . Drug use: No  . Sexual activity: Yes    Birth control/protection: None  Lifestyle  . Physical activity:    Days per week: Not on file    Minutes per session: Not on file  . Stress: Not on file  Relationships  . Social connections:    Talks on phone: Not on file    Gets together: Not on file    Attends religious service: Not on file    Active member of club or organization: Not on file    Attends meetings of clubs or  organizations: Not on file    Relationship status: Not on file  Other Topics Concern  . Not on file  Social History Narrative  . Not on file    Family History: Family History  Problem Relation Age of Onset  . Diabetes Mother   . Stroke Paternal Grandmother   . Alcohol abuse Neg Hx   . Arthritis Neg Hx   . Asthma Neg Hx   . Birth defects Neg Hx   . Cancer Neg Hx   . COPD Neg Hx   . Depression Neg Hx   . Drug abuse Neg Hx   . Early death Neg Hx   . Hearing loss Neg Hx   . Heart disease Neg Hx   . Hyperlipidemia Neg Hx   . Hypertension Neg Hx   . Kidney disease Neg Hx   . Learning disabilities Neg Hx   . Mental illness Neg Hx   . Mental retardation Neg Hx   .  Miscarriages / Stillbirths Neg Hx   . Vision loss Neg Hx     Allergies: No Known Allergies  Medications Prior to Admission  Medication Sig Dispense Refill Last Dose  . Prenatal Vit-Fe Fumarate-FA (PRENATAL COMPLETE) 14-0.4 MG TABS Take 1 tablet by mouth at bedtime. 60 each 4 07/27/2018 at Unknown time  . valACYclovir (VALTREX) 500 MG tablet Take 1 tablet (500 mg total) by mouth 2 (two) times daily. 60 tablet 6 07/28/2018 at Unknown time        Review of Systems   Constitutional: Negative for fever and chills Eyes: Negative for visual disturbances Respiratory: Negative for shortness of breath, dyspnea Cardiovascular: Negative for chest pain or palpitations  Gastrointestinal: Negative for vomiting, diarrhea and constipation.  POSITIVE for abdominal pain (contractions) Genitourinary: Negative for dysuria and urgency Musculoskeletal: Negative for back pain, joint pain, myalgias  Neurological: Negative for dizziness and headaches      Blood pressure 138/86, pulse 97, temperature 97.7 F (36.5 C), temperature source Oral, resp. rate 16, height 5\' 2"  (1.575 m), weight 83 kg, last menstrual period 02/28/2018, SpO2 100 %, unknown if currently breastfeeding. General appearance: alert, cooperative and no  distress Lungs: clear to auscultation bilaterally Heart: regular rate and rhythm Abdomen: soft, non-tender; bowel sounds normal Extremities: Homans sign is negative, no sign of DVT DTR's 2+ Presentation: cephalic Fetal monitoring  Baseline: 130 bpm Uterine activity  q3-4; moderate Dilation: 5.5 Effacement (%): 90 Station: -1 Exam by:: Ginger Morris RN   Prenatal labs: ABO, Rh: --/--/B POS (07/29 1704) Antibody: NEG (07/29 1704) Rubella: 2.19 (07/29 1704) RPR: Non Reactive (07/29 1704)  HBsAg: Negative (07/29 1704)  HIV: Non Reactive (07/29 1704)  GBS:   pos 1 hr Glucola passed Genetic screening  Not done Anatomy US normal  Prenatal Transfer Tool  Maternal Diabetes: No Genetic Screening: Declined Maternal Ultrasounds/Referrals: Normal Fetal Ultrasounds or other Referrals:  None Maternal Substance Abuse:  No Significant Maternal Medications:  Meds include: Other: valtrex  Significant Maternal Lab Results: None     Results for orders placed or performed during the hospital encounter of 07/28/18 (from the past 24 hour(s))  CBC   Collection Time: 07/28/18 10:27 AM  Result Value Ref Range   WBC 10.1 4.0 - 10.5 K/uL   RBC 4.63 3.87 - 5.11 MIL/uL   Hemoglobin 12.1 12.0 - 15.0 g/dL   HCT 16.1 09.6 - 04.5 %   MCV 80.6 80.0 - 100.0 fL   MCH 26.1 26.0 - 34.0 pg   MCHC 32.4 30.0 - 36.0 g/dL   RDW 40.9 81.1 - 91.4 %   Platelets 266 150 - 400 K/uL   nRBC 0.0 0.0 - 0.2 %    Assessment: Shelly Tate is a 27 y.o. [phone] with an IUP at [redacted]w[redacted]d presenting for SOL.  Patient to receive PCN; will then AROM. She wants epidural after AROM.  Has been taking Valtrex since 36 weeks; no recent outbreaks.   Plan: #Labor: expectant management #Pain:  Per request #FWB Cat 1 #ID: GBS: pos #MOF:  both #MOC: Nexplanon #Circ: NA   Charlesetta Garibaldi Corrion Stirewalt 07/28/2018, 11:34 AM

## 2018-07-29 LAB — CBC
HCT: 33.7 % — ABNORMAL LOW (ref 36.0–46.0)
HEMOGLOBIN: 11.1 g/dL — AB (ref 12.0–15.0)
MCH: 26.2 pg (ref 26.0–34.0)
MCHC: 32.9 g/dL (ref 30.0–36.0)
MCV: 79.5 fL — AB (ref 80.0–100.0)
NRBC: 0 % (ref 0.0–0.2)
Platelets: 245 10*3/uL (ref 150–400)
RBC: 4.24 MIL/uL (ref 3.87–5.11)
RDW: 15.3 % (ref 11.5–15.5)
WBC: 12.2 10*3/uL — ABNORMAL HIGH (ref 4.0–10.5)

## 2018-07-29 LAB — RPR: RPR: NONREACTIVE

## 2018-07-29 NOTE — Anesthesia Postprocedure Evaluation (Signed)
Anesthesia Post Note  Patient: Shelly Tate  Procedure(s) Performed: AN AD HOC LABOR EPIDURAL     Patient location during evaluation: Mother Baby Anesthesia Type: Epidural Level of consciousness: awake and alert, oriented and patient cooperative Pain management: pain level controlled Vital Signs Assessment: post-procedure vital signs reviewed and stable Respiratory status: spontaneous breathing Cardiovascular status: stable Postop Assessment: no headache, epidural receding, patient able to bend at knees and no signs of nausea or vomiting Anesthetic complications: no Comments: Pain score 0.    Last Vitals:  Vitals:   07/28/18 2348 07/29/18 0547  BP: 120/63 124/72  Pulse: 66 65  Resp: 18 18  Temp: 36.7 C 36.6 C  SpO2: 100% 100%    Last Pain:  Vitals:   07/29/18 0547  TempSrc: Oral  PainSc:    Pain Goal:                 [facility]

## 2018-07-29 NOTE — Progress Notes (Signed)
Post Partum Day 1. S/p SVD 07/28/18 @ 1420 Subjective: no complaints, up ad lib, voiding, tolerating PO and + flatus  Objective: Blood pressure 124/72, pulse 65, temperature 97.9 F (36.6 C), temperature source Oral, resp. rate 18, height 5\' 2"  (1.575 m), weight 83 kg, last menstrual period 02/28/2018, SpO2 100 %, unknown if currently breastfeeding.  Physical Exam:  General: alert, cooperative, appears stated age and no distress Lochia: appropriate Uterine Fundus: firm Incision: N/A DVT Evaluation: No evidence of DVT seen on physical exam.  Recent Labs    07/28/18 1027 07/29/18 0531  HGB 12.1 11.1*  HCT 37.3 33.7*    Assessment/Plan: Plan for discharge tomorrow, Interval BTL scheduled for November 26   LOS: 1 day   Calvert Cantor, CNM 07/29/2018, 9:29 AM

## 2018-07-30 NOTE — Discharge Summary (Signed)
OB Discharge Summary     Patient Name: Shelly Tate DOB: 1991/01/18 MRN: [id number]  Date of admission: 07/28/2018 Delivering MD: Marylene Land   Date of discharge: 07/30/2018  Admitting diagnosis: 38WK CONTRACTION APART Intrauterine pregnancy: [redacted]w[redacted]d     Secondary diagnosis:  Active Problems:   History of gestational diabetes in prior pregnancy, currently pregnant   Hx of preeclampsia, prior pregnancy, currently pregnant   Late prenatal care affecting pregnancy   HSV (herpes simplex virus) anogenital infection   Excessive weight gain during pregnancy in third trimester   GBS (group B Streptococcus carrier), +RV culture, currently pregnant   Labor and delivery indication for care or intervention  Additional problems: none     Discharge diagnosis: Term Pregnancy Delivered                                                                                                Post partum procedures:none  Augmentation: AROM  Complications: None  Hospital course:  Onset of Labor With Vaginal Delivery     27 y.o. yo [id number] at [redacted]w[redacted]d was admitted in Active Labor on 07/28/2018. Patient had an uncomplicated labor course as follows:  Membrane Rupture Time/Date: 2:04 PM ,07/28/2018   Intrapartum Procedures: Episiotomy: None [1]                                         Lacerations:  None [1]  Patient had a delivery of a Viable infant. 07/28/2018  Information for the patient's newborn:  Nechelle, Petrizzo [[id number]]  Delivery Method: Vaginal, Spontaneous(Filed from Delivery Summary)    Pateint had an uncomplicated postpartum course.  She is ambulating, tolerating a regular diet, passing flatus, and urinating well. Patient is discharged home in stable condition on 07/30/18.   Physical exam  Vitals:   07/28/18 2348 07/29/18 0547 07/29/18 1900 07/30/18 0534  BP: 120/63 124/72 (!) 100/58 128/79  Pulse: 66 65 62 (!) 54  Resp: 18 18 18 18   Temp: 98.1 F (36.7  C) 97.9 F (36.6 C) 97.7 F (36.5 C) 97.9 F (36.6 C)  TempSrc: Oral Oral Oral Oral  SpO2: 100% 100% 100%   Weight:      Height:       General: alert Lochia: appropriate Uterine Fundus: firm Incision: N/A DVT Evaluation: No evidence of DVT seen on physical exam. Labs: Lab Results  Component Value Date   WBC 12.2 (H) 07/29/2018   HGB 11.1 (L) 07/29/2018   HCT 33.7 (L) 07/29/2018   MCV 79.5 (L) 07/29/2018   PLT 245 07/29/2018   CMP Latest Ref Rng & Units 07/28/2018  Glucose 70 - 99 mg/dL 85  BUN 6 - 20 mg/dL 9  Creatinine 4.01 - 0.27 mg/dL 2.53  Sodium 664 - 403 mmol/L 136  Potassium 3.5 - 5.1 mmol/L 3.4(L)  Chloride 98 - 111 mmol/L 105  CO2 22 - 32 mmol/L 22  Calcium 8.9 - 10.3 mg/dL 8.9  Total Protein 6.5 - 8.1 g/dL 7.4  Total Bilirubin  0.3 - 1.2 mg/dL 0.4  Alkaline Phos 38 - 126 U/L 149(H)  AST 15 - 41 U/L 21  ALT 0 - 44 U/L 12    Discharge instruction: per After Visit Summary and "Baby and Me Booklet".  After visit meds:  Allergies as of 07/30/2018   No Known Allergies     Medication List    STOP taking these medications   valACYclovir 500 MG tablet Commonly known as:  VALTREX     TAKE these medications   PRENATAL COMPLETE 14-0.4 MG Tabs Take 1 tablet by mouth at bedtime.       Diet: routine diet  Activity: Advance as tolerated. Pelvic rest for 6 weeks.   Outpatient follow up:4 weeks Follow up Appt: Future Appointments  Date Time Provider Department Center  08/29/2018  1:15 PM Adam Phenix, MD [facility]   Follow up Visit:No follow-ups on file.  Postpartum contraception: Tubal Ligation  Newborn Data: Live born female  Birth Weight: 7 lb 1.1 oz (3205 g) APGAR: 9, 9  Newborn Delivery   Birth date/time:  07/28/2018 14:20:00 Delivery type:  Vaginal, Spontaneous     Baby Feeding: both Disposition:home with mother   07/30/2018 Ted Mcalpine, MD

## 2018-07-30 NOTE — Discharge Instructions (Signed)
You should receive a call to schedule your postpartum follow-up visit. If you do not receive a call, please call clinic in 4-6 weeks for a postpartum check.   Taking care of yourself after Baby arrives 1. Vaginal Bleeding: Vaginal bleeding is common after delivery, with the amount decreasing gradually over 1-2 weeks. If the bleeding increases, is mixed with pus, or is foul-smelling, call your doctor, as this may be a sign of infection.  2. Abdominal Pain: Abdominal cramping after delivery is common, especially when you breastfeed. The same hormones responsible for letting milk down to your nipple also contract your uterus. If the pain worsens, or occurs more frequently over 48 hrs after delivery, call your doctor.  3. Fevers: After delivery you are at increased risk of developing an infection. If you have a fever, increased vaginal bleeding, foul-smelling vaginal discharge, or increased abdominal pain, call your doctor.  4. Breast Feeding: Feeding every 1.5-3 hours keeps Baby satisfied and your milk in good supply. If 3 hours have gone by and Baby is sleeping, wake him/her up to feed. Nurse for 15-20 minutes on one breast before offering the other. Breast-fed babies often lose up to 7% of their birth weight in the first few days of life, but should start gaining about an ounce per day after 4 days.   5. Mastitis (Breast infection): Breaks in the skin or bacteria passing into your breast ducts can cause an infection. If you notice a triangular shaped area on your breast that is red, warm to the touch and tender, call your doctor. It is safe for Baby and helps you to heal faster if you keep breast feeding through this infection.  6. Postpartum Depression: Postpartum depression is very common after a woman delivers because of all the hormonal changes happening in her body. If you notice that you start to feel more sad or anxious than usual or have any thoughts of hurting yourself or Baby, tell someone  right away.  If you have any questions or concerns, please call your doctor.

## 2018-08-01 ENCOUNTER — Encounter: Payer: Medicaid Other | Admitting: Obstetrics & Gynecology

## 2018-08-02 ENCOUNTER — Other Ambulatory Visit: Payer: Self-pay | Admitting: Obstetrics & Gynecology

## 2018-08-02 NOTE — Progress Notes (Signed)
Orders for surgery 

## 2018-08-08 ENCOUNTER — Encounter: Payer: Medicaid Other | Admitting: Family Medicine

## 2018-08-15 ENCOUNTER — Encounter (HOSPITAL_COMMUNITY): Payer: Self-pay | Admitting: *Deleted

## 2018-08-15 ENCOUNTER — Other Ambulatory Visit: Payer: Self-pay

## 2018-08-29 ENCOUNTER — Ambulatory Visit: Payer: Medicaid Other | Admitting: Obstetrics & Gynecology

## 2018-08-31 ENCOUNTER — Ambulatory Visit: Payer: Medicaid Other | Admitting: Family Medicine

## 2018-09-01 ENCOUNTER — Encounter: Payer: Self-pay | Admitting: Family Medicine

## 2018-09-01 NOTE — Progress Notes (Signed)
Patient did not keep appointment today. She will be called to reschedule.  

## 2018-09-04 ENCOUNTER — Encounter (HOSPITAL_COMMUNITY): Payer: Self-pay | Admitting: Anesthesiology

## 2018-09-04 NOTE — Anesthesia Preprocedure Evaluation (Deleted)
Anesthesia Evaluation    Reviewed: Allergy & Precautions, Patient's Chart, lab work & pertinent test results  Airway        Dental   Pulmonary former smoker,           Cardiovascular negative cardio ROS       Neuro/Psych negative neurological ROS     GI/Hepatic negative GI ROS, Neg liver ROS,   Endo/Other  diabetesObese, BMI 33  Renal/GU negative Renal ROS     Musculoskeletal negative musculoskeletal ROS (+)   Abdominal   Peds  Hematology negative hematology ROS (+)   Anesthesia Other Findings Day of surgery medications reviewed with the patient.  Reproductive/Obstetrics (+) Breast feeding                             Anesthesia Physical Anesthesia Plan  ASA: II  Anesthesia Plan: General   Post-op Pain Management:    Induction: Intravenous  PONV Risk Score and Plan: 3 and Treatment may vary due to age or medical condition, Ondansetron, Dexamethasone and Midazolam  Airway Management Planned: Oral ETT  Additional Equipment:   Intra-op Plan:   Post-operative Plan: Extubation in OR  Informed Consent:   Plan Discussed with:   Anesthesia Plan Comments: (CASE CANCELLED (per patient))       Anesthesia Quick Evaluation

## 2018-09-06 ENCOUNTER — Ambulatory Visit (HOSPITAL_COMMUNITY)
Admission: AD | Admit: 2018-09-06 | Payer: Medicaid Other | Source: Ambulatory Visit | Admitting: Obstetrics & Gynecology

## 2018-09-06 HISTORY — DX: Herpesviral infection, unspecified: B00.9

## 2018-09-06 SURGERY — LIGATION, FALLOPIAN TUBE, LAPAROSCOPIC
Anesthesia: Choice | Laterality: Bilateral

## 2018-11-02 ENCOUNTER — Ambulatory Visit (INDEPENDENT_AMBULATORY_CARE_PROVIDER_SITE_OTHER): Payer: Medicaid Other | Admitting: Obstetrics & Gynecology

## 2018-11-02 VITALS — BP 115/78 | HR 50 | Ht 62.0 in | Wt 167.0 lb

## 2018-11-02 DIAGNOSIS — Z30013 Encounter for initial prescription of injectable contraceptive: Secondary | ICD-10-CM

## 2018-11-02 DIAGNOSIS — Z3202 Encounter for pregnancy test, result negative: Secondary | ICD-10-CM | POA: Diagnosis not present

## 2018-11-02 LAB — POCT PREGNANCY, URINE: Preg Test, Ur: NEGATIVE

## 2018-11-02 MED ORDER — MEDROXYPROGESTERONE ACETATE 150 MG/ML IM SUSP: 150.0000 mg | Freq: Once | INTRAMUSCULAR | Status: DC

## 2018-11-02 NOTE — Progress Notes (Addendum)
   Subjective:    Patient ID: Shelly Tate, female    DOB: 07/21/1991, 28 y.o.   MRN: [id number]  HPI 28 yo single P4 (5, 3, 1yo kids and a 38 month old child). She is here to get depo provera. She has not had sex since delivery.    Review of Systems Her last pap was normal on 09/30/16. She had a flu vaccine this season.    Objective:   Physical Exam Breathing, conversing, and ambulating normally Well nourished, well hydrated Black female, no apparent distress Abd- benign     Assessment & Plan:  Desire for contraception- restart depo provera today Preventative- she thinks that she had Gardasil already Come back q 3 months

## 2018-11-30 NOTE — Telephone Encounter (Signed)
Error

## 2019-01-31 ENCOUNTER — Telehealth: Payer: Self-pay | Admitting: Family Medicine

## 2019-01-31 NOTE — Telephone Encounter (Signed)
Attempted to call patient to let her know the office's new address and process change. No answer, left detailed message with this information as well as office number if she had any questions or concerns.

## 2019-02-01 ENCOUNTER — Other Ambulatory Visit: Payer: Self-pay

## 2019-02-01 ENCOUNTER — Ambulatory Visit (INDEPENDENT_AMBULATORY_CARE_PROVIDER_SITE_OTHER): Payer: Medicaid Other | Admitting: *Deleted

## 2019-02-01 VITALS — BP 128/83 | HR 65 | Ht 63.0 in | Wt 148.7 lb

## 2019-02-01 DIAGNOSIS — Z30013 Encounter for initial prescription of injectable contraceptive: Secondary | ICD-10-CM | POA: Diagnosis not present

## 2019-02-01 MED ORDER — MEDROXYPROGESTERONE ACETATE 150 MG/ML IM SUSP
150.0000 mg | Freq: Once | INTRAMUSCULAR | Status: AC
  Administered 2019-02-01: 09:00:00 150 mg via INTRAMUSCULAR

## 2019-02-01 NOTE — Progress Notes (Addendum)
Shelly Tate here for Depo-Provera  Injection.  Pt reports that she did receive the depo provera injection that was ordered on 11/02/18.  Injection administered without complication. Patient will return in 3 months for next injection.  Osvaldo Human, RN 02/01/2019  9:23 AM

## 2019-02-02 NOTE — Progress Notes (Signed)
I have reviewed this chart and agree with the RN/CMA assessment and management.    Daqwan Dougal C Jessilyn Catino, MD, FACOG Attending Physician, Faculty Practice [facility]  

## 2019-04-19 ENCOUNTER — Telehealth: Payer: Self-pay | Admitting: Obstetrics & Gynecology

## 2019-04-19 NOTE — Telephone Encounter (Signed)
Called the patient to complete the pre-screen. The patient answered no to COVID19 symptoms and/or being previously diagnosed. Informed the patient of the wearing a face mask, sanitizing hands at the sanitizing station upon entering our office, and no visitors or children are allowed due to the COVID19 restrictions. The patient verbalized understanding. °

## 2019-04-20 ENCOUNTER — Other Ambulatory Visit: Payer: Self-pay

## 2019-04-20 ENCOUNTER — Ambulatory Visit (INDEPENDENT_AMBULATORY_CARE_PROVIDER_SITE_OTHER): Payer: Medicaid Other

## 2019-04-20 DIAGNOSIS — Z3042 Encounter for surveillance of injectable contraceptive: Secondary | ICD-10-CM | POA: Diagnosis not present

## 2019-04-20 MED ORDER — MEDROXYPROGESTERONE ACETATE 150 MG/ML IM SUSP
150.0000 mg | Freq: Once | INTRAMUSCULAR | Status: AC
  Administered 2019-04-20: 150 mg via INTRAMUSCULAR

## 2019-04-20 NOTE — Progress Notes (Signed)
Shelly Tate here for Depo-Provera  Injection.  Injection administered without complication. Patient will return in 3 months for next injection.  Verdell Carmine, RN 04/20/2019  9:09 AM

## 2019-04-21 NOTE — Progress Notes (Signed)
I have reviewed this chart and agree with the RN/CMA assessment and management.    Ikeisha Blumberg C Vince Ainsley, MD, FACOG Attending Physician, Faculty Practice [facility]  

## 2019-07-05 ENCOUNTER — Telehealth: Payer: Self-pay | Admitting: Family Medicine

## 2019-07-05 NOTE — Telephone Encounter (Signed)
Attempted to call patient about her appointment on 9/24 @ 9:20. No answer left voicemail instructing patient to wear a face mask for the entire appointment and no visitors are allowed during the visit. Patient instructed not to attend the appointment if she was any symptoms. Symptom list and office number left.

## 2019-07-06 ENCOUNTER — Ambulatory Visit: Payer: Medicaid Other

## 2020-04-17 ENCOUNTER — Ambulatory Visit: Payer: Medicaid Other | Admitting: Obstetrics

## 2020-07-04 ENCOUNTER — Telehealth: Payer: Self-pay

## 2020-07-04 NOTE — Telephone Encounter (Signed)
Pt called and reports that she has been bleeding heavily for 2 days. Pt reports she had a menstrual cycle earlier this month and is not due for one yet. Pt has been having unprotected intercourse, has not used any birth control. Pt states that she has been bleeding heavily enough to fill up more than 1 pad per hour with "large black clots". I advised pt to go be evaluated at the hospital at this time for heavy bleeding. Pt voices understanding.

## 2020-08-19 ENCOUNTER — Other Ambulatory Visit: Payer: Self-pay

## 2020-08-26 ENCOUNTER — Other Ambulatory Visit (HOSPITAL_COMMUNITY)
Admission: RE | Admit: 2020-08-26 | Discharge: 2020-08-26 | Disposition: A | Discharge status: Home / Self Care | Payer: Medicaid Other | Source: Ambulatory Visit | Attending: Obstetrics | Admitting: Obstetrics

## 2020-08-26 ENCOUNTER — Other Ambulatory Visit: Payer: Self-pay

## 2020-08-26 ENCOUNTER — Ambulatory Visit (INDEPENDENT_AMBULATORY_CARE_PROVIDER_SITE_OTHER): Payer: Medicaid Other | Admitting: Obstetrics

## 2020-08-26 ENCOUNTER — Encounter: Payer: Self-pay | Admitting: Obstetrics

## 2020-08-26 VITALS — BP 143/87 | HR 68 | Ht 62.0 in | Wt 117.0 lb

## 2020-08-26 DIAGNOSIS — Z113 Encounter for screening for infections with a predominantly sexual mode of transmission: Secondary | ICD-10-CM

## 2020-08-26 DIAGNOSIS — Z720 Tobacco use: Secondary | ICD-10-CM

## 2020-08-26 DIAGNOSIS — Z3009 Encounter for other general counseling and advice on contraception: Secondary | ICD-10-CM | POA: Diagnosis not present

## 2020-08-26 DIAGNOSIS — Z01419 Encounter for gynecological examination (general) (routine) without abnormal findings: Secondary | ICD-10-CM | POA: Insufficient documentation

## 2020-08-26 DIAGNOSIS — N898 Other specified noninflammatory disorders of vagina: Secondary | ICD-10-CM | POA: Insufficient documentation

## 2020-08-26 DIAGNOSIS — Z8619 Personal history of other infectious and parasitic diseases: Secondary | ICD-10-CM

## 2020-08-26 DIAGNOSIS — R634 Abnormal weight loss: Secondary | ICD-10-CM

## 2020-08-26 DIAGNOSIS — Z0000 Encounter for general adult medical examination without abnormal findings: Secondary | ICD-10-CM

## 2020-08-26 DIAGNOSIS — N6452 Nipple discharge: Secondary | ICD-10-CM

## 2020-08-26 LAB — POCT URINE PREGNANCY: Preg Test, Ur: NEGATIVE

## 2020-08-26 MED ORDER — VALACYCLOVIR HCL 500 MG PO TABS: 500.0000 mg | ORAL_TABLET | Freq: Two times a day (BID) | ORAL | 11 refills | Status: DC | PRN

## 2020-08-26 NOTE — Progress Notes (Signed)
Pt presents for annual pap  Normal pap 12/20 Pt reports colostrum leaking from nipples  UPT negative

## 2020-08-26 NOTE — Progress Notes (Signed)
Subjective:        Shelly Tate is a 29 y.o. female here for a routine exam.  Current complaints: Vaginal discharge.  Boyfriend felt something squirt in his mouth while sucking the breasts, so she started squeezing her breasts and got a clear discharge from nipples.  Also has had a significant weight loss over the past few months.  She feel that it may be related to depression caused by the loss of her baby's father in a car accident and the death of her nephew at the same time.  She denies HA, visual changes or irritability.  Personal health questionnaire:  Is patient Ashkenazi Jewish, have a family history of breast and/or ovarian cancer: no Is there a family history of uterine cancer diagnosed at age < 22, gastrointestinal cancer, urinary tract cancer, family member who is a Personnel officer syndrome-associated carrier: no Is the patient overweight and hypertensive, family history of diabetes, personal history of gestational diabetes, preeclampsia or PCOS: no Is patient over 50, have PCOS,  family history of premature CHD under age 76, diabetes, smoke, have hypertension or peripheral artery disease:  no At any time, has a partner hit, kicked or otherwise hurt or frightened you?: no Over the past 2 weeks, have you felt down, depressed or hopeless?: no Over the past 2 weeks, have you felt little interest or pleasure in doing things?:no   Gynecologic History Patient's last menstrual period was 07/17/2020. Contraception: none Last Pap: 09-30-2016. Results were: normal Last mammogram: n/a. Results were: n/a  Obstetric History OB History  Gravida Para Term Preterm AB Living  4 4 4     4   SAB TAB Ectopic Multiple Live Births        0 4    # Outcome Date GA Lbr Len/2nd Weight Sex Delivery Anes PTL Lv  4 Term 07/28/18 [redacted]w[redacted]d 07:05 / 00:15 7 lb 1.1 oz (3.205 kg) F Vag-Spont EPI  LIV  3 Term 04/20/17 [redacted]w[redacted]d 10:20 / 00:09 6 lb 4.9 oz (2.86 kg) F Vag-Spont EPI  LIV  2 Term 04/18/15 [redacted]w[redacted]d   M  Vag-Spont  N LIV  1 Term 10/21/13 [redacted]w[redacted]d -05:52 / 01:28 7 lb 3 oz (3.26 kg) M Vag-Spont EPI  LIV    Past Medical History:  Diagnosis Date  . Chlamydia   . Gestational diabetes    Resolved - only with 2018 pregnancy, none with 07/2018 preg.  08/2018 HSV infection   . SVD (spontaneous vaginal delivery)    x 4  . Trichomonas infection     Past Surgical History:  Procedure Laterality Date  . NO PAST SURGERIES       Current Outpatient Medications:  .  valACYclovir (VALTREX) 500 MG tablet, Take 1 tablet (500 mg total) by mouth 2 (two) times daily as needed (for outbreaks)., Disp: 30 tablet, Rfl: 11  Current Facility-Administered Medications:  .  medroxyPROGESTERone (DEPO-PROVERA) injection 150 mg, 150 mg, Intramuscular, Once, Dove, Myra C, MD No Known Allergies  Social History   Tobacco Use  . Smoking status: Current Every Day Smoker    Packs/day: 0.25    Years: 2.00    Pack years: 0.50    Types: Cigarettes  . Smokeless tobacco: Never Used  Substance Use Topics  . Alcohol use: No    Family History  Problem Relation Age of Onset  . Diabetes Mother   . Stroke Paternal Grandmother   . Alcohol abuse Neg Hx   . Arthritis Neg Hx   . Asthma Neg  Hx   . Birth defects Neg Hx   . Cancer Neg Hx   . COPD Neg Hx   . Depression Neg Hx   . Drug abuse Neg Hx   . Early death Neg Hx   . Hearing loss Neg Hx   . Heart disease Neg Hx   . Hyperlipidemia Neg Hx   . Hypertension Neg Hx   . Kidney disease Neg Hx   . Learning disabilities Neg Hx   . Mental illness Neg Hx   . Mental retardation Neg Hx   . Miscarriages / Stillbirths Neg Hx   . Vision loss Neg Hx       Review of Systems  Constitutional: positive for weight loss Respiratory: negative for cough and wheezing Cardiovascular: negative for chest pain, fatigue and palpitations Gastrointestinal: negative for abdominal pain and change in bowel habits Musculoskeletal:negative for myalgias Neurological: negative for gait problems and  tremors Behavioral/Psych: negative for abusive relationship, depression Endocrine: negative for temperature intolerance    Genitourinary:negative for abnormal menstrual periods, genital lesions, hot flashes, sexual problems.  Positive for vaginal discharge Integument/breast: negative for breast lump, breast tenderness.  Positive for nipple discharge-clear    Objective:       BP (!) 143/87   Pulse 68   Ht 5\' 2"  (1.575 m)   Wt 117 lb (53.1 kg)   LMP 07/17/2020   BMI 21.40 kg/m  General:   alert and no distress  Skin:   no rash or abnormalities  Lungs:   clear to auscultation bilaterally  Heart:   regular rate and rhythm, S1, S2 normal, no murmur, click, rub or gallop  Breasts:   normal without suspicious masses, skin or nipple changes or axillary nodes  Abdomen:  normal findings: no organomegaly, soft, non-tender and no hernia  Pelvis:  External genitalia: normal general appearance Urinary system: urethral meatus normal and bladder without fullness, nontender Vaginal: normal without tenderness, induration or masses Cervix: normal appearance Adnexa: normal bimanual exam Uterus: anteverted and non-tender, normal size   Lab Review Urine pregnancy test Labs reviewed yes Radiologic studies reviewed no  50% of 20 min visit spent on counseling and coordination of care.   Assessment:     1. Encounter for gynecological examination with Papanicolaou smear of cervix Rx: - Cytology - PAP - POCT urine pregnancy  2. Vaginal discharge Rx: - Cervicovaginal ancillary only( [place])  3. Screening for STD (sexually transmitted disease) Rx: - HIV Antibody (routine testing w rflx) - Hepatitis B surface antigen - RPR - Hepatitis C antibody  4. Encounter for other general counseling and advice on contraception - declines contraception  5. Bilateral nipple discharge.  Probably physically induced Rx: - Prolactin  6. Abnormal unintentional weight loss Rx: - TSH  7. H/O  herpes genitalis - Valtrex prn  8. Tobacco abuse - cessation recommended with the aid of medication and behavioral modification  9. Routine adult health maintenance Rx: - Ambulatory referral to Internal Medicine    Plan:    Education reviewed: calcium supplements, depression evaluation, low fat, low cholesterol diet, safe sex/STD prevention, self breast exams, smoking cessation and weight bearing exercise. Follow up in: 1 year.   Meds ordered this encounter  Medications  . valACYclovir (VALTREX) 500 MG tablet    Sig: Take 1 tablet (500 mg total) by mouth 2 (two) times daily as needed (for outbreaks).    Dispense:  30 tablet    Refill:  11   Orders Placed This Encounter  Procedures  . Prolactin  . TSH  . HIV Antibody (routine testing w rflx)  . Hepatitis B surface antigen  . RPR  . Hepatitis C antibody  . Ambulatory referral to Internal Medicine    Referral Priority:   Routine    Referral Type:   Consultation    Referral Reason:   Specialty Services Required    Requested Specialty:   Internal Medicine    Number of Visits Requested:   1  . POCT urine pregnancy    Brock Bad, MD 08/26/2020 11:14 AM

## 2020-08-27 LAB — RPR: RPR Ser Ql: NONREACTIVE

## 2020-08-27 LAB — HEPATITIS B SURFACE ANTIGEN: Hepatitis B Surface Ag: NEGATIVE

## 2020-08-27 LAB — PROLACTIN: Prolactin: 12.4 ng/mL (ref 4.8–23.3)

## 2020-08-27 LAB — TSH: TSH: 0.535 u[IU]/mL (ref 0.450–4.500)

## 2020-08-27 LAB — HIV ANTIBODY (ROUTINE TESTING W REFLEX): HIV Screen 4th Generation wRfx: NONREACTIVE

## 2020-08-27 LAB — HEPATITIS C ANTIBODY: Hep C Virus Ab: <0.1 s/co ratio (ref 0.0–0.9)

## 2020-08-28 ENCOUNTER — Other Ambulatory Visit: Payer: Self-pay | Admitting: Obstetrics

## 2020-08-28 DIAGNOSIS — B9689 Other specified bacterial agents as the cause of diseases classified elsewhere: Secondary | ICD-10-CM

## 2020-08-28 DIAGNOSIS — N760 Acute vaginitis: Secondary | ICD-10-CM

## 2020-08-28 DIAGNOSIS — B379 Candidiasis, unspecified: Secondary | ICD-10-CM

## 2020-08-28 LAB — CYTOLOGY - PAP
Diagnosis: NEGATIVE
Diagnosis: REACTIVE

## 2020-08-28 LAB — CERVICOVAGINAL ANCILLARY ONLY
Bacterial Vaginitis (gardnerella): POSITIVE — AB
Candida Glabrata: NEGATIVE
Candida Vaginitis: POSITIVE — AB
Chlamydia: NEGATIVE
Comment: NEGATIVE
Comment: NEGATIVE
Comment: NEGATIVE
Comment: NEGATIVE
Comment: NEGATIVE
Comment: NORMAL
Neisseria Gonorrhea: NEGATIVE
Trichomonas: NEGATIVE

## 2020-08-28 MED ORDER — FLUCONAZOLE 150 MG PO TABS: 150.0000 mg | ORAL_TABLET | Freq: Once | ORAL | 0 refills | Status: AC

## 2020-08-28 MED ORDER — METRONIDAZOLE 500 MG PO TABS: 500.0000 mg | ORAL_TABLET | Freq: Two times a day (BID) | ORAL | 2 refills | Status: DC

## 2020-09-13 ENCOUNTER — Inpatient Hospital Stay (HOSPITAL_COMMUNITY)
Admission: AD | Admit: 2020-09-13 | Discharge: 2020-09-13 | Disposition: A | Discharge status: Home / Self Care | Payer: Medicaid Other | Attending: Family Medicine | Admitting: Family Medicine

## 2020-09-13 ENCOUNTER — Other Ambulatory Visit: Payer: Self-pay

## 2020-09-13 DIAGNOSIS — R140 Abdominal distension (gaseous): Secondary | ICD-10-CM | POA: Diagnosis not present

## 2020-09-13 DIAGNOSIS — R634 Abnormal weight loss: Secondary | ICD-10-CM | POA: Diagnosis not present

## 2020-09-13 DIAGNOSIS — Z3202 Encounter for pregnancy test, result negative: Secondary | ICD-10-CM | POA: Insufficient documentation

## 2020-09-13 DIAGNOSIS — R109 Unspecified abdominal pain: Secondary | ICD-10-CM

## 2020-09-13 LAB — URINALYSIS, ROUTINE W REFLEX MICROSCOPIC
Bilirubin Urine: NEGATIVE
Glucose, UA: NEGATIVE mg/dL
Hgb urine dipstick: NEGATIVE
Ketones, ur: NEGATIVE mg/dL
Leukocytes,Ua: NEGATIVE
Nitrite: NEGATIVE
Protein, ur: NEGATIVE mg/dL
Specific Gravity, Urine: 1.021 (ref 1.005–1.030)
pH: 7 (ref 5.0–8.0)

## 2020-09-13 LAB — CBC
HCT: 38.8 % (ref 36.0–46.0)
Hemoglobin: 13.1 g/dL (ref 12.0–15.0)
MCH: 28.3 pg (ref 26.0–34.0)
MCHC: 33.8 g/dL (ref 30.0–36.0)
MCV: 83.8 fL (ref 80.0–100.0)
Platelets: 320 10*3/uL (ref 150–400)
RBC: 4.63 MIL/uL (ref 3.87–5.11)
RDW: 13.9 % (ref 11.5–15.5)
WBC: 6.9 10*3/uL (ref 4.0–10.5)
nRBC: 0 % (ref 0.0–0.2)

## 2020-09-13 LAB — POCT PREGNANCY, URINE: Preg Test, Ur: NEGATIVE

## 2020-09-13 LAB — HCG, QUANTITATIVE, PREGNANCY: hCG, Beta Chain, Quant, S: 1 m[IU]/mL (ref <5)

## 2020-09-13 MED ORDER — SIMETHICONE 80 MG PO CHEW: 80.0000 mg | CHEWABLE_TABLET | Freq: Four times a day (QID) | ORAL | 0 refills | Status: DC | PRN

## 2020-09-13 NOTE — MAU Provider Note (Signed)
Presented for symptoms and report of positive UPT at home.   [name] is a 29 y.o. [phone] patient who presents to MAU today with complaint of abdominal cramping and bloating.  Has lost weight over recent months.  States has no appetite.  Took UPT at home and it was positive. .   O BP (!) 143/77 (BP Location: Right Arm)   Pulse 72   Temp 98.6 F (37 C) (Oral)   Resp 17   Ht 5\' 2"  (1.575 m)   Wt 52.6 kg   LMP 08/27/2020   SpO2 100%   BMI 21.22 kg/m  Physical Exam Constitutional:      General: She is not in acute distress.    Appearance: She is well-developed. She is not ill-appearing.  HENT:     Head: Normocephalic.  Cardiovascular:     Rate and Rhythm: Normal rate.  Skin:    General: Skin is warm and dry.  Neurological:     Mental Status: She is alert.    Results for orders placed or performed during the hospital encounter of 09/13/20 (from the past 24 hour(s))  Urinalysis, Routine w reflex microscopic     Status: Abnormal   Collection Time: 09/13/20  3:17 AM  Result Value Ref Range   Color, Urine YELLOW YELLOW   APPearance HAZY (A) CLEAR   Specific Gravity, Urine 1.021 1.005 - 1.030   pH 7.0 5.0 - 8.0   Glucose, UA NEGATIVE NEGATIVE mg/dL   Hgb urine dipstick NEGATIVE NEGATIVE   Bilirubin Urine NEGATIVE NEGATIVE   Ketones, ur NEGATIVE NEGATIVE mg/dL   Protein, ur NEGATIVE NEGATIVE mg/dL   Nitrite NEGATIVE NEGATIVE   Leukocytes,Ua NEGATIVE NEGATIVE  Pregnancy, urine POC     Status: None   Collection Time: 09/13/20  3:25 AM  Result Value Ref Range   Preg Test, Ur NEGATIVE NEGATIVE  hCG, quantitative, pregnancy     Status: None   Collection Time: 09/13/20  3:28 AM  Result Value Ref Range   hCG, Beta Chain, Quant, S 1 <5 mIU/mL  CBC     Status: None   Collection Time: 09/13/20  3:28 AM  Result Value Ref Range   WBC 6.9 4.0 - 10.5 K/uL   RBC 4.63 3.87 - 5.11 MIL/uL   Hemoglobin 13.1 12.0 - 15.0 g/dL   HCT 14/03/21 36 - 46 %   MCV 83.8 80.0 - 100.0  fL   MCH 28.3 26.0 - 34.0 pg   MCHC 33.8 30.0 - 36.0 g/dL   RDW 62.2 29.7 - 98.9 %   Platelets 320 150 - 400 K/uL   nRBC 0.0 0.0 - 0.2 %     A Medical screening exam complete Abdominal cramping and bloating Weight loss Positive UPT at home with negative blood test here  P Discharge from MAU in stable condition Patient given the option of transfer to [facility] for further evaluation or seek care in outpatient facility of choice List of options for follow-up given   Recommend Full evaluation by a primary MD Rx SImethicone 80mg  for gas/bloating. Discussed this may relieve some pain but will not fix underlying problems Warning signs for worsening condition that would warrant emergency follow-up discussed Patient may return to MAU as needed   [facility], CNM 09/13/2020 4:40 AM

## 2020-09-13 NOTE — MAU Note (Signed)
Pt reports positive home preg test 2 days ago, states her stomach feels bloated and she does not have an appetite. Also reports lower abd cramping.

## 2020-09-13 NOTE — Discharge Instructions (Signed)
Abdominal Bloating When you have abdominal bloating, your abdomen may feel full, tight, or painful. It may also look bigger than normal or swollen (distended). Common causes of abdominal bloating include:  Swallowing air.  Constipation.  Problems digesting food.  Eating too much.  Irritable bowel syndrome. This is a condition that affects the large intestine.  Lactose intolerance. This is an inability to digest lactose, a natural sugar in dairy products.  Celiac disease. This is a condition that affects the ability to digest gluten, a protein found in some grains.  Gastroparesis. This is a condition that slows down the movement of food in the stomach and small intestine. It is more common in people with diabetes mellitus.  Gastroesophageal reflux disease (GERD). This is a digestive condition that makes stomach acid flow back into the esophagus.  Urinary retention. This means that the body is holding onto urine, and the bladder cannot be emptied all the way. Follow these instructions at home: Eating and drinking  Avoid eating too much.  Try not to swallow air while talking or eating.  Avoid eating while lying down.  Avoid these foods and drinks: ? Foods that cause gas, such as broccoli, cabbage, cauliflower, and baked beans. ? Carbonated drinks. ? Hard candy. ? Chewing gum. Medicines  Take over-the-counter and prescription medicines only as told by your health care provider.  Take probiotic medicines. These medicines contain live bacteria or yeasts that can help digestion.  Take coated peppermint oil capsules. Activity  Try to exercise regularly. Exercise may help to relieve bloating that is caused by gas and relieve constipation. General instructions  Keep all follow-up visits as told by your health care provider. This is important. Contact a health care provider if:  You have nausea and vomiting.  You have diarrhea.  You have abdominal pain.  You have unusual  weight loss or weight gain.  You have severe pain, and medicines do not help. Get help right away if:  You have severe chest pain.  You have trouble breathing.  You have shortness of breath.  You have trouble urinating.  You have darker urine than normal.  You have blood in your stools or have dark, tarry stools. Summary  Abdominal bloating means that the abdomen is swollen.  Common causes of abdominal bloating are swallowing air, constipation, and problems digesting food.  Avoid eating too much and avoid swallowing air.  Avoid foods that cause gas, carbonated drinks, hard candy, and chewing gum. This information is not intended to replace advice given to you by your health care provider. Make sure you discuss any questions you have with your health care provider. Document Revised: 01/16/2019 Document Reviewed: 10/30/2016 Elsevier Patient Education  2020 Elsevier Inc.  

## 2020-09-17 ENCOUNTER — Other Ambulatory Visit: Payer: Self-pay

## 2020-09-17 ENCOUNTER — Ambulatory Visit (HOSPITAL_COMMUNITY)
Admission: EM | Admit: 2020-09-17 | Discharge: 2020-09-17 | Disposition: A | Discharge status: Home / Self Care | Payer: Medicaid Other | Attending: Internal Medicine | Admitting: Internal Medicine

## 2020-09-17 ENCOUNTER — Encounter (HOSPITAL_COMMUNITY): Payer: Self-pay | Admitting: Emergency Medicine

## 2020-09-17 ENCOUNTER — Ambulatory Visit (INDEPENDENT_AMBULATORY_CARE_PROVIDER_SITE_OTHER): Payer: Medicaid Other

## 2020-09-17 DIAGNOSIS — M79642 Pain in left hand: Secondary | ICD-10-CM

## 2020-09-17 DIAGNOSIS — S60222A Contusion of left hand, initial encounter: Secondary | ICD-10-CM | POA: Diagnosis not present

## 2020-09-17 DIAGNOSIS — Z23 Encounter for immunization: Secondary | ICD-10-CM | POA: Diagnosis not present

## 2020-09-17 DIAGNOSIS — S61259A Open bite of unspecified finger without damage to nail, initial encounter: Secondary | ICD-10-CM

## 2020-09-17 DIAGNOSIS — W503XXA Accidental bite by another person, initial encounter: Secondary | ICD-10-CM

## 2020-09-17 MED ORDER — TETANUS-DIPHTH-ACELL PERTUSSIS 5-2.5-18.5 LF-MCG/0.5 IM SUSY
PREFILLED_SYRINGE | INTRAMUSCULAR | Status: AC
  Filled 2020-09-17: qty 0.5

## 2020-09-17 MED ORDER — IBUPROFEN 800 MG PO TABS
800.0000 mg | ORAL_TABLET | Freq: Once | ORAL | Status: AC
  Administered 2020-09-17: 800 mg via ORAL

## 2020-09-17 MED ORDER — AMOXICILLIN-POT CLAVULANATE 875-125 MG PO TABS: 1.0000 | ORAL_TABLET | Freq: Two times a day (BID) | ORAL | 0 refills | Status: AC

## 2020-09-17 MED ORDER — TETANUS-DIPHTH-ACELL PERTUSSIS 5-2.5-18.5 LF-MCG/0.5 IM SUSY
0.5000 mL | PREFILLED_SYRINGE | Freq: Once | INTRAMUSCULAR | Status: AC
  Administered 2020-09-17: 0.5 mL via INTRAMUSCULAR

## 2020-09-17 MED ORDER — IBUPROFEN 800 MG PO TABS
ORAL_TABLET | ORAL | Status: AC
  Filled 2020-09-17: qty 1

## 2020-09-17 MED ORDER — IBUPROFEN 800 MG PO TABS: 800.0000 mg | ORAL_TABLET | Freq: Three times a day (TID) | ORAL | 0 refills | Status: DC | PRN

## 2020-09-17 NOTE — Discharge Instructions (Signed)
Your xray is normal today, there are no broken bones.  Obvious bruising to the hand and fingers.  Apply ice, use of ibuprofen, ace wrap as needed for pain control.  Activity as tolerated.  Cleanse the wound/puncture to middle finger daily with soap and water.  5 days of antibiotics to prevent infection.  Return for any signs of infection- redness, swelling, warmth or increased pain.
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
## 2021-05-20 ENCOUNTER — Other Ambulatory Visit: Payer: Self-pay

## 2021-05-20 ENCOUNTER — Ambulatory Visit (INDEPENDENT_AMBULATORY_CARE_PROVIDER_SITE_OTHER): Payer: Medicaid Other

## 2021-05-20 VITALS — BP 120/72 | HR 60 | Ht 62.0 in | Wt 119.0 lb

## 2021-05-20 DIAGNOSIS — Z30013 Encounter for initial prescription of injectable contraceptive: Secondary | ICD-10-CM

## 2021-05-20 LAB — POCT URINE PREGNANCY: Preg Test, Ur: NEGATIVE

## 2021-05-20 MED ORDER — MEDROXYPROGESTERONE ACETATE 150 MG/ML IM SUSP: 150.0000 mg | INTRAMUSCULAR | 1 refills | Status: DC

## 2021-05-20 NOTE — Progress Notes (Signed)
Pt presents to office today for first UPT to restart Depo Provera injections. UPT in office today was negative. LMP: 04/21/21. Pt will return to office in 10 days, 05/29/21, for 2nd upt and to receive Depo injection. Pt was advised to refrain from intercourse between now and next appointment. Pt agreed and verbalized understanding.

## 2021-06-03 ENCOUNTER — Ambulatory Visit: Payer: Medicaid Other

## 2021-06-05 ENCOUNTER — Telehealth: Payer: Self-pay

## 2021-06-05 NOTE — Telephone Encounter (Signed)
Return call to pt regarding triage vm  C/O cramping after intercourse LMP: 05/26/21 Pt thinks she could be pregnant missed Depo appt Pt stated she had to work Pt advised she needs to miss her cycle first to rule out pregnancy , then take a at home UPT Pt advised discomfort could be from impact of intercourse w/ her partner and can take OTC IBP or tylenol.  Pt voiced understanding.

## 2021-09-02 ENCOUNTER — Inpatient Hospital Stay (HOSPITAL_COMMUNITY)
Admission: AD | Admit: 2021-09-02 | Discharge: 2021-09-02 | Disposition: A | Discharge status: Home / Self Care | Payer: Medicaid Other | Attending: Obstetrics & Gynecology | Admitting: Obstetrics & Gynecology

## 2021-09-02 DIAGNOSIS — Z789 Other specified health status: Secondary | ICD-10-CM | POA: Diagnosis not present

## 2021-09-02 DIAGNOSIS — R110 Nausea: Secondary | ICD-10-CM | POA: Insufficient documentation

## 2021-09-02 DIAGNOSIS — Z3202 Encounter for pregnancy test, result negative: Secondary | ICD-10-CM | POA: Diagnosis present

## 2021-09-02 LAB — POCT PREGNANCY, URINE: Preg Test, Ur: NEGATIVE

## 2021-09-02 NOTE — MAU Provider Note (Signed)
Event Date/Time   First Provider Initiated Contact with Patient 09/02/21 1227      S [name] is a 30 y.o. [phone] patient who presents to MAU today with complaint of possible pregnancy and nausea. Patient reports nausea and "spitting" started yesterday. She denies vomiting or sick contacts. She also reports that she has felt "fetal movement" for the last week. She has not taken a pregnancy test at home and she is not trying to become pregnant. She denies abdominal pain, vaginal bleeding, or discharge.   O BP (!) 148/90 (BP Location: Right Arm)   Pulse (!) 122   Temp 98.4 F (36.9 C) (Oral)   Resp 16   Ht 5\' 2"  (1.575 m)   Wt 54.7 kg   LMP 07/22/2021 Comment: depo in Aug  SpO2 96%   BMI 22.06 kg/m  Physical Exam Vitals and nursing note reviewed.  Constitutional:      General: She is not in acute distress. Cardiovascular:     Rate and Rhythm: Tachycardia present.  Pulmonary:     Effort: Pulmonary effort is normal.  Abdominal:     General: There is no distension.     Palpations: Abdomen is soft.     Tenderness: There is no abdominal tenderness.  Skin:    General: Skin is warm and dry.  Neurological:     General: No focal deficit present.     Mental Status: She is alert and oriented to person, place, and time.  Psychiatric:        Mood and Affect: Mood normal.        Behavior: Behavior normal.    A Medical screening exam complete Negative pregnancy test Nausea  P Discharge from MAU in stable condition Patient given the option of transfer to [facility] or Urgent Care for further evaluation or seek care in outpatient facility of choice. Patient has appointment next week at [facility] and will follow up there.  Warning signs for worsening condition that would warrant emergency follow-up discussed Patient may return to MAU as needed    [facility], CNM 09/02/2021 12:38 PM

## 2021-09-02 NOTE — MAU Note (Signed)
Feeling nausea, started yesterday. feeling movement in her abd for 2 wks. Appetite is decreasing. Has not done a test.  Had depo in Aug.  Has been cramping in her stomach too.

## 2021-09-11 ENCOUNTER — Other Ambulatory Visit: Payer: Self-pay

## 2021-09-11 ENCOUNTER — Ambulatory Visit (INDEPENDENT_AMBULATORY_CARE_PROVIDER_SITE_OTHER): Payer: Medicaid Other | Admitting: Obstetrics

## 2021-09-11 ENCOUNTER — Other Ambulatory Visit (HOSPITAL_COMMUNITY)
Admission: RE | Admit: 2021-09-11 | Discharge: 2021-09-11 | Disposition: A | Discharge status: Home / Self Care | Payer: Medicaid Other | Source: Ambulatory Visit | Attending: Obstetrics | Admitting: Obstetrics

## 2021-09-11 ENCOUNTER — Encounter: Payer: Self-pay | Admitting: Obstetrics

## 2021-09-11 VITALS — BP 153/94 | HR 85 | Ht 62.0 in | Wt 123.6 lb

## 2021-09-11 DIAGNOSIS — Z01419 Encounter for gynecological examination (general) (routine) without abnormal findings: Secondary | ICD-10-CM

## 2021-09-11 DIAGNOSIS — Z113 Encounter for screening for infections with a predominantly sexual mode of transmission: Secondary | ICD-10-CM

## 2021-09-11 DIAGNOSIS — R140 Abdominal distension (gaseous): Secondary | ICD-10-CM

## 2021-09-11 DIAGNOSIS — Z720 Tobacco use: Secondary | ICD-10-CM

## 2021-09-11 DIAGNOSIS — Z3202 Encounter for pregnancy test, result negative: Secondary | ICD-10-CM | POA: Diagnosis not present

## 2021-09-11 DIAGNOSIS — N898 Other specified noninflammatory disorders of vagina: Secondary | ICD-10-CM | POA: Diagnosis not present

## 2021-09-11 LAB — POCT URINE PREGNANCY: Preg Test, Ur: NEGATIVE
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
## 2021-09-11 NOTE — Progress Notes (Signed)
Pt presents for annual, pap, and all STD testing.  Pt reports abdominal bloating and weight gain.  She requests HCG quant  Last Depo injection Sept or Oct 2022 administered at a different office (unsure name) per pt  UPT neg

## 2021-09-12 ENCOUNTER — Other Ambulatory Visit: Payer: Self-pay | Admitting: Obstetrics

## 2021-09-12 DIAGNOSIS — B9689 Other specified bacterial agents as the cause of diseases classified elsewhere: Secondary | ICD-10-CM

## 2021-09-12 DIAGNOSIS — A599 Trichomoniasis, unspecified: Secondary | ICD-10-CM

## 2021-09-12 DIAGNOSIS — N760 Acute vaginitis: Secondary | ICD-10-CM

## 2021-09-12 LAB — CERVICOVAGINAL ANCILLARY ONLY
Bacterial Vaginitis (gardnerella): POSITIVE — AB
Candida Glabrata: NEGATIVE
Candida Vaginitis: NEGATIVE
Chlamydia: NEGATIVE
Comment: NEGATIVE
Comment: NEGATIVE
Comment: NEGATIVE
Comment: NEGATIVE
Comment: NEGATIVE
Comment: NORMAL
Neisseria Gonorrhea: NEGATIVE
Trichomonas: POSITIVE — AB

## 2021-09-12 LAB — RPR: RPR Ser Ql: NONREACTIVE

## 2021-09-12 LAB — BETA HCG QUANT (REF LAB): hCG Quant: <1 m[IU]/mL

## 2021-09-12 LAB — HEPATITIS C ANTIBODY: Hep C Virus Ab: 0.2 s/co ratio (ref 0.0–0.9)

## 2021-09-12 LAB — HEPATITIS B SURFACE ANTIGEN: Hepatitis B Surface Ag: NEGATIVE

## 2021-09-12 LAB — HIV ANTIBODY (ROUTINE TESTING W REFLEX): HIV Screen 4th Generation wRfx: NONREACTIVE

## 2021-09-12 MED ORDER — METRONIDAZOLE 500 MG PO TABS: 500.0000 mg | ORAL_TABLET | Freq: Two times a day (BID) | ORAL | 2 refills | Status: DC

## 2021-09-16 ENCOUNTER — Telehealth: Payer: Self-pay

## 2021-09-16 LAB — CYTOLOGY - PAP
Diagnosis: NEGATIVE
Diagnosis: REACTIVE

## 2021-09-16 NOTE — Telephone Encounter (Signed)
Error

## 2021-09-16 NOTE — Telephone Encounter (Signed)
Pt returning phone call from earlier message. Pt made aware of lab results and advised to abstain from any unprotected intercourse for at least 10-14 days after she and partner(s) have received treatment. Pt advised to let partner(s) know so partner(s) can be test and treated. Informed pt partner(s) can be tested at PCP or local health department.  Pt has no further questions at this time. Pt scheduled for TOC in 4 weeks.

## 2021-09-17 ENCOUNTER — Other Ambulatory Visit: Payer: Self-pay | Admitting: Obstetrics

## 2021-09-17 ENCOUNTER — Telehealth: Payer: Self-pay

## 2021-09-17 NOTE — Telephone Encounter (Addendum)
Third attempt by office to contact pt. Message left on pt's voicemail letting her know the office had results for her and that she can call the office for results. Pt also aware I will be sending MyChart message with results and she can view results that way and respond back with questions via MyChart.    ----- Message from Brock Bad, MD sent at 09/12/2021  3:50 PM EST ----- Flagyl Rx for Trichomonas and BV

## 2021-09-22 ENCOUNTER — Ambulatory Visit (HOSPITAL_COMMUNITY): Admission: RE | Admit: 2021-09-22 | Payer: Medicaid Other | Source: Ambulatory Visit

## 2021-09-24 ENCOUNTER — Other Ambulatory Visit: Payer: Self-pay

## 2021-09-24 ENCOUNTER — Ambulatory Visit (HOSPITAL_COMMUNITY)
Admission: RE | Admit: 2021-09-24 | Discharge: 2021-09-24 | Disposition: A | Discharge status: Home / Self Care | Payer: Medicaid Other | Source: Ambulatory Visit | Attending: Obstetrics | Admitting: Obstetrics

## 2021-09-24 DIAGNOSIS — R140 Abdominal distension (gaseous): Secondary | ICD-10-CM | POA: Diagnosis present

## 2021-09-26 ENCOUNTER — Encounter: Payer: Self-pay | Admitting: Obstetrics

## 2021-09-26 ENCOUNTER — Telehealth (INDEPENDENT_AMBULATORY_CARE_PROVIDER_SITE_OTHER): Payer: Medicaid Other | Admitting: Obstetrics

## 2021-09-26 DIAGNOSIS — R140 Abdominal distension (gaseous): Secondary | ICD-10-CM

## 2021-09-26 NOTE — Progress Notes (Signed)
GYNECOLOGY VIRTUAL VISIT ENCOUNTER NOTE  Provider location: [facility] for [facility]   Patient location: Home  I connected with Shelly Tate on 09/26/21 at  8:35 AM EST by MyChart Video Encounter and verified that I am speaking with the correct person using two identifiers.   I discussed the limitations, risks, security and privacy concerns of performing an evaluation and management service virtually and the availability of in person appointments. I also discussed with the patient that there may be a patient responsible charge related to this service. The patient expressed understanding and agreed to proceed.   History:  Shelly Tate is a 30 y.o. [phone] female being evaluated today for ultrasound results and follow up for abdominal bloating. She denies any abnormal vaginal discharge, bleeding, pelvic pain or other concerns.       Past Medical History:  Diagnosis Date   Chlamydia    Gestational diabetes    Resolved - only with 2018 pregnancy, none with 07/2018 preg.   HSV infection    SVD (spontaneous vaginal delivery)    x 4   Trichomonas infection    Past Surgical History:  Procedure Laterality Date   NO PAST SURGERIES     The following portions of the patient's history were reviewed and updated as appropriate: allergies, current medications, past family history, past medical history, past social history, past surgical history and problem list.   Health Maintenance:  Normal pap and negative HRHPV on 09-11-2021.    Review of Systems:  Pertinent items noted in HPI and remainder of comprehensive ROS otherwise negative.  Physical Exam:   General:  Alert, oriented and cooperative. Patient appears to be in no acute distress.  Mental Status: Normal mood and affect. Normal behavior. Normal judgment and thought content.   Respiratory: Normal respiratory effort, no problems with respiration noted  Rest of physical exam deferred due to type of  encounter  Labs and Imaging No results found for this or any previous visit (from the past 336 hour(s)). US PELVIC COMPLETE WITH TRANSVAGINAL  Result Date: 09/24/2021 CLINICAL DATA:  Abdominal bloating and pelvic pain, LMP 09/15/2026 EXAM: TRANSABDOMINAL AND TRANSVAGINAL ULTRASOUND OF PELVIS TECHNIQUE: Both transabdominal and transvaginal ultrasound examinations of the pelvis were performed. Transabdominal technique was performed for global imaging of the pelvis including uterus, ovaries, adnexal regions, and pelvic cul-de-sac. It was necessary to proceed with endovaginal exam following the transabdominal exam to visualize the endometrium and adnexa. COMPARISON:  None FINDINGS: Uterus Measurements: 8.9 x 4.0 x 6.0 cm = volume: 110 mL. Anteverted. Normal morphology without mass Endometrium Thickness: 3 mm.  No endometrial fluid or mass Right ovary Measurements: 4.6 x 1.7 x 2.2 cm = volume: 8.7 mL. Normal morphology without mass Left ovary Measurements: 4.8 x 1.6 x 2.9 cm = volume: 11.7 mL. Normal morphology without mass Other findings No free pelvic fluid.  No adnexal masses. IMPRESSION: Normal exam. Electronically Signed   By: Ulyses Southward M.D.   On: 09/24/2021 15:52       Assessment and Plan:     1. Abdominal bloating - ultrasound WNL's.  Probable GI etiology. - refer to GI - follow up prn       I discussed the assessment and treatment plan with the patient. The patient was provided an opportunity to ask questions and all were answered. The patient agreed with the plan and demonstrated an understanding of the instructions.   The patient was advised to call back or seek an in-person evaluation/go  to the ED if the symptoms worsen or if the condition fails to improve as anticipated.  I have spent a total of 15 minutes of non-face-to-face time, excluding clinical staff time, reviewing notes and preparing to see patient, ordering tests and/or medications, and counseling the patient.    Coral Ceo, MD Center for [facility]  09/26/21

## 2021-10-14 ENCOUNTER — Ambulatory Visit: Payer: Medicaid Other

## 2022-08-05 ENCOUNTER — Ambulatory Visit: Payer: Medicaid Other

## 2022-08-19 ENCOUNTER — Ambulatory Visit: Payer: Medicaid Other

## 2022-10-04 ENCOUNTER — Emergency Department (HOSPITAL_COMMUNITY)
Admission: EM | Admit: 2022-10-04 | Discharge: 2022-10-04 | Disposition: A | Discharge status: Home / Self Care | Payer: Medicaid Other | Attending: Emergency Medicine | Admitting: Emergency Medicine

## 2022-10-04 DIAGNOSIS — Z20822 Contact with and (suspected) exposure to covid-19: Secondary | ICD-10-CM | POA: Diagnosis not present

## 2022-10-04 DIAGNOSIS — J101 Influenza due to other identified influenza virus with other respiratory manifestations: Secondary | ICD-10-CM | POA: Diagnosis not present

## 2022-10-04 DIAGNOSIS — R519 Headache, unspecified: Secondary | ICD-10-CM

## 2022-10-04 LAB — COMPREHENSIVE METABOLIC PANEL
ALT: 18 U/L (ref 0–44)
AST: 26 U/L (ref 15–41)
Albumin: 3.7 g/dL (ref 3.5–5.0)
Alkaline Phosphatase: 48 U/L (ref 38–126)
Anion gap: 8 (ref 5–15)
BUN: 7 mg/dL (ref 6–20)
CO2: 28 mmol/L (ref 22–32)
Calcium: 8.7 mg/dL — ABNORMAL LOW (ref 8.9–10.3)
Chloride: 102 mmol/L (ref 98–111)
Creatinine, Ser: 0.92 mg/dL (ref 0.44–1.00)
GFR, Estimated: >60 mL/min (ref >60)
Glucose, Bld: 118 mg/dL — ABNORMAL HIGH (ref 70–99)
Potassium: 3.6 mmol/L (ref 3.5–5.1)
Sodium: 138 mmol/L (ref 135–145)
Total Bilirubin: 0.4 mg/dL (ref 0.3–1.2)
Total Protein: 6.7 g/dL (ref 6.5–8.1)

## 2022-10-04 LAB — URINALYSIS, ROUTINE W REFLEX MICROSCOPIC
Bilirubin Urine: NEGATIVE
Glucose, UA: NEGATIVE mg/dL
Ketones, ur: NEGATIVE mg/dL
Leukocytes,Ua: NEGATIVE
Nitrite: NEGATIVE
Protein, ur: 30 mg/dL — AB
Specific Gravity, Urine: 1.024 (ref 1.005–1.030)
pH: 6 (ref 5.0–8.0)

## 2022-10-04 LAB — CBC WITH DIFFERENTIAL/PLATELET
Abs Immature Granulocytes: 0 10*3/uL (ref 0.00–0.07)
Basophils Absolute: 0 10*3/uL (ref 0.0–0.1)
Basophils Relative: 0 %
Eosinophils Absolute: 0.1 10*3/uL (ref 0.0–0.5)
Eosinophils Relative: 2 %
HCT: 39.2 % (ref 36.0–46.0)
Hemoglobin: 13 g/dL (ref 12.0–15.0)
Lymphocytes Relative: 44 %
Lymphs Abs: 1.7 10*3/uL (ref 0.7–4.0)
MCH: 27.7 pg (ref 26.0–34.0)
MCHC: 33.2 g/dL (ref 30.0–36.0)
MCV: 83.6 fL (ref 80.0–100.0)
Monocytes Absolute: 0.3 10*3/uL (ref 0.1–1.0)
Monocytes Relative: 7 %
Neutro Abs: 1.8 10*3/uL (ref 1.7–7.7)
Neutrophils Relative %: 47 %
Platelets: 218 10*3/uL (ref 150–400)
RBC: 4.69 MIL/uL (ref 3.87–5.11)
RDW: 13.9 % (ref 11.5–15.5)
WBC: 3.9 10*3/uL — ABNORMAL LOW (ref 4.0–10.5)
nRBC: 0 % (ref 0.0–0.2)
nRBC: 0 /100 WBC

## 2022-10-04 LAB — RESP PANEL BY RT-PCR (RSV, FLU A&B, COVID)  RVPGX2
Influenza A by PCR: NEGATIVE
Influenza B by PCR: POSITIVE — AB
Resp Syncytial Virus by PCR: NEGATIVE
SARS Coronavirus 2 by RT PCR: NEGATIVE

## 2022-10-04 LAB — LIPASE, BLOOD: Lipase: 52 U/L — ABNORMAL HIGH (ref 11–51)

## 2022-10-04 MED ORDER — ONDANSETRON 4 MG PO TBDP
4.0000 mg | ORAL_TABLET | Freq: Once | ORAL | Status: AC
  Administered 2022-10-04: 4 mg via ORAL
  Filled 2022-10-04: qty 1

## 2022-10-04 MED ORDER — KETOROLAC TROMETHAMINE 15 MG/ML IJ SOLN
15.0000 mg | Freq: Once | INTRAMUSCULAR | Status: AC
  Administered 2022-10-04: 15 mg via INTRAMUSCULAR
  Filled 2022-10-04: qty 1

## 2022-10-04 MED ORDER — OSELTAMIVIR PHOSPHATE 75 MG PO CAPS: 75.0000 mg | ORAL_CAPSULE | Freq: Two times a day (BID) | ORAL | 0 refills | Status: DC

## 2022-10-04 MED ORDER — ACETAMINOPHEN 325 MG PO TABS
650.0000 mg | ORAL_TABLET | Freq: Once | ORAL | Status: AC
  Administered 2022-10-04: 650 mg via ORAL
  Filled 2022-10-04: qty 2

## 2022-10-04 MED ORDER — PROCHLORPERAZINE EDISYLATE 10 MG/2ML IJ SOLN
10.0000 mg | Freq: Once | INTRAMUSCULAR | Status: AC
  Administered 2022-10-04: 10 mg via INTRAMUSCULAR
  Filled 2022-10-04: qty 2

## 2022-10-04 NOTE — ED Triage Notes (Signed)
Pt to ED c/o HA and nausea x 1 week.

## 2022-10-04 NOTE — Discharge Instructions (Addendum)
Your exam and workup today was overall reassuring.  Your respiratory panel was positive for flu B.  I have given you prescription for Tamiflu to take.  You can continue taking Tylenol ibuprofen for your headache.  You received medication in the emergency room with some improvement in your headache as well.  Continue to hydrate and drink plenty of fluids.  I have also given information to follow-up with internal medicine clinic.

## 2022-10-04 NOTE — ED Provider Notes (Signed)
[facility] EMERGENCY DEPARTMENT Provider Note   CSN: [id number] Arrival date & time: 10/04/22  1825     History  Chief Complaint  Patient presents with   Headache   Nausea    Shelly Tate is a 31 y.o. female.  31 year old female presents today for evaluation of headache, nausea that has been ongoing for several days.  Denies difficulty tolerating p.o. intake, fever, abdominal pain.  Denies any change in vision.  Denies prior history of migraines.  Endorses productive cough but denies emesis.  Without photophobia, or other vision change.  The history is provided by the patient. No language interpreter was used.       Home Medications Prior to Admission medications   Medication Sig Start Date End Date Taking? Authorizing Provider  oseltamivir (TAMIFLU) 75 MG capsule Take 1 capsule (75 mg total) by mouth every 12 (twelve) hours. 10/04/22  Yes Eladia Frame, PA-C  medroxyPROGESTERone (DEPO-PROVERA) 150 MG/ML injection Inject 1 mL (150 mg total) into the muscle every 3 (three) months. Patient not taking: Reported on 09/11/2021 05/20/21   Brock Bad, MD  metroNIDAZOLE (FLAGYL) 500 MG tablet Take 1 tablet (500 mg total) by mouth 2 (two) times daily. 09/12/21   Brock Bad, MD      Allergies    Patient has no known allergies.    Review of Systems   Review of Systems  Constitutional:  Negative for chills and fever.  HENT:  Negative for sore throat, trouble swallowing and voice change.   Eyes:  Negative for photophobia and visual disturbance.  Respiratory:  Positive for cough. Negative for shortness of breath.   Gastrointestinal:  Positive for nausea. Negative for abdominal pain and vomiting.  Neurological:  Positive for headaches. Negative for light-headedness.  All other systems reviewed and are negative.   Physical Exam Updated Vital Signs BP 138/83 (BP Location: Right Arm)   Pulse (!) 112   Temp 99.8 F (37.7 C) (Oral)   Resp 18   Ht 5'  2" (1.575 m)   Wt 59 kg   LMP 10/04/2022   SpO2 100%   BMI 23.78 kg/m  Physical Exam Vitals and nursing note reviewed.  Constitutional:      General: She is not in acute distress.    Appearance: Normal appearance. She is not ill-appearing.  HENT:     Head: Normocephalic and atraumatic.     Nose: Nose normal.  Eyes:     General: No scleral icterus.    Extraocular Movements: Extraocular movements intact.     Conjunctiva/sclera: Conjunctivae normal.  Cardiovascular:     Rate and Rhythm: Normal rate and regular rhythm.     Pulses: Normal pulses.     Heart sounds: Normal heart sounds.  Pulmonary:     Effort: Pulmonary effort is normal. No respiratory distress.     Breath sounds: Normal breath sounds. No wheezing or rales.  Abdominal:     General: There is no distension.     Tenderness: There is no abdominal tenderness.  Musculoskeletal:        General: Normal range of motion.     Cervical back: Normal range of motion.  Skin:    General: Skin is warm and dry.  Neurological:     General: No focal deficit present.     Mental Status: She is alert and oriented to person, place, and time. Mental status is at baseline.     Comments: Cranial nerves III through XII intact.  Full  range of motion in bilateral upper and lower extremities with 5/5 strength in the extensor and flexor muscle groups.  Without pronator drift.  Tongue midline.  Without facial asymmetry.  Normal speech.     ED Results / Procedures / Treatments   Labs (all labs ordered are listed, but only abnormal results are displayed) Labs Reviewed  RESP PANEL BY RT-PCR (RSV, FLU A&B, COVID)  RVPGX2 - Abnormal; Notable for the following components:      Result Value   Influenza B by PCR POSITIVE (*)    All other components within normal limits  COMPREHENSIVE METABOLIC PANEL - Abnormal; Notable for the following components:   Glucose, Bld 118 (*)    Calcium 8.7 (*)    All other components within normal limits  CBC WITH  DIFFERENTIAL/PLATELET - Abnormal; Notable for the following components:   WBC 3.9 (*)    All other components within normal limits  LIPASE, BLOOD - Abnormal; Notable for the following components:   Lipase 52 (*)    All other components within normal limits  URINALYSIS, ROUTINE W REFLEX MICROSCOPIC - Abnormal; Notable for the following components:   APPearance HAZY (*)    Hgb urine dipstick LARGE (*)    Protein, ur 30 (*)    Bacteria, UA RARE (*)    All other components within normal limits    EKG None  Radiology No results found.  Procedures Procedures    Medications Ordered in ED Medications  acetaminophen (TYLENOL) tablet 650 mg (650 mg Oral Given 10/04/22 1844)  ondansetron (ZOFRAN-ODT) disintegrating tablet 4 mg (4 mg Oral Given 10/04/22 1844)  ketorolac (TORADOL) 15 MG/ML injection 15 mg (15 mg Intramuscular Given 10/04/22 2055)  prochlorperazine (COMPAZINE) injection 10 mg (10 mg Intramuscular Given 10/04/22 2056)    ED Course/ Medical Decision Making/ A&P                           Medical Decision Making Risk Prescription drug management.   Medical Decision Making / ED Course   This patient presents to the ED for concern of headache, this involves an extensive number of treatment options, and is a complaint that carries with it a high risk of complications and morbidity.  The differential diagnosis includes viral URI, migraine headache, tension headache, cluster headache  MDM: 31 year old female presents with above-mentioned complaints.  Will provide migraine cocktail.  Neurological exam without focal deficits.  Improved following migraine cocktail.  CBC with WBC of 3.9 otherwise unremarkable.  UA without evidence of UTI.  CMP with glucose 118 otherwise unremarkable.  Lipase of 52 without significant elevation.  Respiratory panel positive for influenza B.  Tamiflu prescribed.  Reports improvement in symptoms.  She is appropriate for discharge.  Discharged in  stable condition.  Return precaution discussed.  Patient voices understanding and is in agreement with plan.  Low suspicion for pneumonia given she is satting 100% on room air without tachypnea.  She is afebrile.   Lab Tests: -I ordered, reviewed, and interpreted labs.   The pertinent results include:   Labs Reviewed  RESP PANEL BY RT-PCR (RSV, FLU A&B, COVID)  RVPGX2 - Abnormal; Notable for the following components:      Result Value   Influenza B by PCR POSITIVE (*)    All other components within normal limits  COMPREHENSIVE METABOLIC PANEL - Abnormal; Notable for the following components:   Glucose, Bld 118 (*)    Calcium 8.7 (*)  All other components within normal limits  CBC WITH DIFFERENTIAL/PLATELET - Abnormal; Notable for the following components:   WBC 3.9 (*)    All other components within normal limits  LIPASE, BLOOD - Abnormal; Notable for the following components:   Lipase 52 (*)    All other components within normal limits  URINALYSIS, ROUTINE W REFLEX MICROSCOPIC - Abnormal; Notable for the following components:   APPearance HAZY (*)    Hgb urine dipstick LARGE (*)    Protein, ur 30 (*)    Bacteria, UA RARE (*)    All other components within normal limits      EKG  EKG Interpretation  Date/Time:    Ventricular Rate:    PR Interval:    QRS Duration:   QT Interval:    QTC Calculation:   R Axis:     Text Interpretation:          Medicines ordered and prescription drug management: Meds ordered this encounter  Medications   acetaminophen (TYLENOL) tablet 650 mg   ondansetron (ZOFRAN-ODT) disintegrating tablet 4 mg   ketorolac (TORADOL) 15 MG/ML injection 15 mg   prochlorperazine (COMPAZINE) injection 10 mg   oseltamivir (TAMIFLU) 75 MG capsule    Sig: Take 1 capsule (75 mg total) by mouth every 12 (twelve) hours.    Dispense:  10 capsule    Refill:  0    Order Specific Question:   Supervising Provider    Answer:   Hyacinth Meeker, BRIAN [[id number]]    -I have  reviewed the patients home medicines and have made adjustments as needed  Reevaluation: After the interventions noted above, I reevaluated the patient and found that they have :improved  Co morbidities that complicate the patient evaluation  Past Medical History:  Diagnosis Date   Chlamydia    Gestational diabetes    Resolved - only with 2018 pregnancy, none with 07/2018 preg.   HSV infection    SVD (spontaneous vaginal delivery)    x 4   Trichomonas infection       Dispostion: Patient is appropriate for discharge.  Discharged in stable condition.  Return precaution discussed.  Patient voices understanding and is in agreement with plan.  Final Clinical Impression(s) / ED Diagnoses Final diagnoses:  Influenza B  Bad headache    Rx / DC Orders ED Discharge Orders          Ordered    oseltamivir (TAMIFLU) 75 MG capsule  Every 12 hours        10/04/22 2133              Marita Kansas, PA-C 10/04/22 2136    Sloan Leiter, DO 10/05/22 1829

## 2022-10-04 NOTE — ED Notes (Signed)
E-signature pad not working, pt gives verbal consent.  

## 2022-10-04 NOTE — ED Provider Triage Note (Signed)
Emergency Medicine Provider Triage Evaluation Note  Shelly Tate , a 31 y.o. female  was evaluated in triage.  Pt complains of left sided headache for the last day also having nausea and vomiting. Reports recent URI symptoms and vomiting mucus. Also endorses marijuana use. States headache is left sided, no associated photophobia, phonophobia, vision blurring, confusion. Has tried ibuprofen w/o relief..  Review of Systems  Positive: Vomiting, nausea Negative: Confusion, blurring vision  Physical Exam  BP 138/83 (BP Location: Right Arm)   Pulse (!) 112   Temp 99.8 F (37.7 C) (Oral)   Resp 18   SpO2 100%  Gen:   Awake, no distress   Resp:  Normal effort  MSK:   Moves extremities without difficulty  Other:  Smells of marijuana, actively retching, no neurodeficits  Medical Decision Making  Medically screening exam initiated at 6:37 PM.  Appropriate orders placed.  Shelly Tate was informed that the remainder of the evaluation will be completed by another provider, this initial triage assessment does not replace that evaluation, and the importance of remaining in the ED until their evaluation is complete.    Pete Pelt, [place] 10/04/22 [phone]

## 2022-10-07 ENCOUNTER — Ambulatory Visit: Payer: Medicaid Other | Admitting: Obstetrics

## 2022-11-11 ENCOUNTER — Ambulatory Visit: Payer: Medicaid Other | Admitting: Obstetrics

## 2022-11-12 ENCOUNTER — Inpatient Hospital Stay (HOSPITAL_COMMUNITY)
Admission: AD | Admit: 2022-11-12 | Discharge: 2022-11-12 | Disposition: A | Discharge status: Home / Self Care | Payer: Medicaid Other | Attending: Obstetrics and Gynecology | Admitting: Obstetrics and Gynecology

## 2022-11-12 ENCOUNTER — Encounter (HOSPITAL_COMMUNITY): Payer: Self-pay | Admitting: *Deleted

## 2022-11-12 DIAGNOSIS — Z3202 Encounter for pregnancy test, result negative: Secondary | ICD-10-CM | POA: Diagnosis present

## 2022-11-12 DIAGNOSIS — R110 Nausea: Secondary | ICD-10-CM | POA: Diagnosis not present

## 2022-11-12 LAB — POCT PREGNANCY, URINE: Preg Test, Ur: NEGATIVE

## 2022-11-12 MED ORDER — ONDANSETRON 8 MG PO TBDP: 8.0000 mg | ORAL_TABLET | Freq: Three times a day (TID) | ORAL | 0 refills | Status: AC | PRN

## 2022-11-12 NOTE — MAU Note (Signed)
Shelly Tate is a 32 y.o. at Unknown here in MAU reporting: last wk only bled for 2 days. Has been having nausea, feels like she is getting bloated. No pain or bleeding currently.  Has not done a home test. LMP: 1/5 Onset of complaint: 2 days ago Pain score: none Vitals:   11/12/22 1016  BP: 130/85  Pulse: 79  Resp: 16  Temp: 98.6 F (37 C)  SpO2: 100%      Lab orders placed from triage:  upt

## 2022-11-12 NOTE — MAU Provider Note (Signed)
Event Date/Time   First Provider Initiated Contact with Patient 11/12/22 1049      [name] is a 32 y.o. [phone] patient who presents to MAU today with complaint of nausea and possible pregnancy. She has not missed a period and did not take a pregnancy test at home. She denies any abdominal pain or bleeding.   O BP 130/85 (BP Location: Right Arm)   Pulse 79   Temp 98.6 F (37 C) (Oral)   Resp 16   Ht 5\' 2"  (1.575 m)   Wt 54.7 kg   LMP 10/16/2022   SpO2 100%   BMI 22.04 kg/m  Physical Exam Vitals and nursing note reviewed.  Constitutional:      General: She is not in acute distress.    Appearance: She is well-developed.  HENT:     Head: Normocephalic.  Eyes:     Pupils: Pupils are equal, round, and reactive to light.  Cardiovascular:     Rate and Rhythm: Normal rate and regular rhythm.     Heart sounds: Normal heart sounds.  Pulmonary:     Effort: Pulmonary effort is normal. No respiratory distress.     Breath sounds: Normal breath sounds.  Abdominal:     General: Bowel sounds are normal. There is no distension.     Palpations: Abdomen is soft.     Tenderness: There is no abdominal tenderness.  Skin:    General: Skin is warm and dry.  Neurological:     Mental Status: She is alert and oriented to person, place, and time.     Deep Tendon Reflexes: Reflexes normal.  Psychiatric:        Mood and Affect: Mood normal.        Behavior: Behavior normal.        Thought Content: Thought content normal.        Judgment: Judgment normal.    Results for orders placed or performed during the hospital encounter of 11/12/22 (from the past 24 hour(s))  Pregnancy, urine POC     Status: None   Collection Time: 11/12/22 10:29 AM  Result Value Ref Range   Preg Test, Ur NEGATIVE NEGATIVE     A Medical screening exam complete 1. Negative pregnancy test   2. Nausea      P -Discharge home in stable condition -Rx for zofran sent to pharmacy -Patient advised to  follow-up with gyn -Patient may return to MAU as needed or if her condition were to change or worsen   Wende Mott, [place] 11/12/2022 10:49 AM

## 2022-12-02 ENCOUNTER — Encounter (HOSPITAL_COMMUNITY): Payer: Self-pay

## 2022-12-02 ENCOUNTER — Inpatient Hospital Stay (HOSPITAL_COMMUNITY)
Admission: AD | Admit: 2022-12-02 | Discharge: 2022-12-02 | Disposition: A | Discharge status: Home / Self Care | Payer: Medicaid Other | Attending: Obstetrics and Gynecology | Admitting: Obstetrics and Gynecology

## 2022-12-02 DIAGNOSIS — R109 Unspecified abdominal pain: Secondary | ICD-10-CM | POA: Diagnosis not present

## 2022-12-02 DIAGNOSIS — Z3202 Encounter for pregnancy test, result negative: Secondary | ICD-10-CM | POA: Diagnosis not present

## 2022-12-02 LAB — POCT PREGNANCY, URINE: Preg Test, Ur: NEGATIVE

## 2022-12-02 NOTE — MAU Note (Addendum)
...  Shelly Tate is a 32 y.o. at Unknown here in MAU reporting: She reports she has been feeling movements in her left side of her stomach and her middle lower abdomen. She reports this has been occurring since last week. She also endorses left lower abdominal pain that began two days ago. Last BM this morning. She also reports loss of appetite. Denies VB.  Negative UPT in MAU.  LMP: End of January Onset of complaint: x1 week Pain score: 3/10 left lower abdomen  Lab orders placed from triage:  POCT Preg

## 2022-12-02 NOTE — MAU Provider Note (Signed)
  S:   32 y.o. [id number] @Unknown$  by LMP presents to MAU for pregnancy confirmation & abdominal pain. She says she has felt movements in her abdomen for the last week & some abdominal cramping. Denies vaginal bleeding. Thinks her period was sometime in January. Has not taken a pregnancy test at home.   O: BP 129/86 (BP Location: Right Arm)   Pulse 71   Temp 98.4 F (36.9 C) (Oral)   Resp 17   Ht 5' 2"$  (1.575 m)   Wt 52.6 kg   LMP 10/16/2022   SpO2 100%   BMI 21.22 kg/m  Physical Examination: General appearance - alert, well appearing, and in no distress, oriented to person, place, and time and acyanotic, in no respiratory distress  Results for orders placed or performed during the hospital encounter of 12/02/22 (from the past 48 hour(s))  Pregnancy, urine POC     Status: None   Collection Time: 12/02/22  4:45 PM  Result Value Ref Range   Preg Test, Ur NEGATIVE NEGATIVE    Comment:        THE SENSITIVITY OF THIS METHODOLOGY IS >24 mIU/mL     A: 1. Negative pregnancy test      P: D/C home Take pregnancy test at home if no menses F/u with [facility] if menstrual irregularities continue or if would like to discuss contraception  Jorje Guild, NP 5:01 PM

## 2023-02-22 ENCOUNTER — Ambulatory Visit: Payer: Medicaid Other | Admitting: Obstetrics and Gynecology

## 2023-07-07 ENCOUNTER — Ambulatory Visit: Payer: Medicaid Other

## 2023-10-27 ENCOUNTER — Ambulatory Visit: Payer: Medicaid Other | Admitting: Obstetrics and Gynecology
